# Patient Record
Sex: Female | Born: 1980 | Hispanic: No | Marital: Married | Smoking: Never smoker
Health system: Southern US, Community
[De-identification: ages and names within clinical notes are randomized; demographics above are authoritative.]

## PROBLEM LIST (undated history)

## (undated) DIAGNOSIS — IMO0002 Reserved for concepts with insufficient information to code with codable children: Secondary | ICD-10-CM

## (undated) DIAGNOSIS — F32A Depression, unspecified: Secondary | ICD-10-CM

## (undated) DIAGNOSIS — R87619 Unspecified abnormal cytological findings in specimens from cervix uteri: Secondary | ICD-10-CM

## (undated) DIAGNOSIS — E669 Obesity, unspecified: Secondary | ICD-10-CM

## (undated) DIAGNOSIS — F329 Major depressive disorder, single episode, unspecified: Secondary | ICD-10-CM

## (undated) DIAGNOSIS — O24419 Gestational diabetes mellitus in pregnancy, unspecified control: Secondary | ICD-10-CM

## (undated) HISTORY — DX: Reserved for concepts with insufficient information to code with codable children: IMO0002

## (undated) HISTORY — DX: Unspecified abnormal cytological findings in specimens from cervix uteri: R87.619

---
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
## 2002-02-07 ENCOUNTER — Other Ambulatory Visit: Admission: RE | Admit: 2002-02-07 | Discharge: 2002-02-07 | Payer: Self-pay | Admitting: Obstetrics and Gynecology

## 2002-08-05 ENCOUNTER — Other Ambulatory Visit: Admission: RE | Admit: 2002-08-05 | Discharge: 2002-08-05 | Payer: Self-pay | Admitting: Obstetrics and Gynecology

## 2003-03-10 ENCOUNTER — Other Ambulatory Visit: Admission: RE | Admit: 2003-03-10 | Discharge: 2003-03-10 | Payer: Self-pay | Admitting: Obstetrics and Gynecology

## 2008-09-19 HISTORY — PX: WISDOM TOOTH EXTRACTION: SHX21

## 2008-11-11 ENCOUNTER — Encounter: Admission: RE | Admit: 2008-11-11 | Discharge: 2008-11-12 | Payer: Self-pay | Admitting: Obstetrics and Gynecology

## 2008-12-16 ENCOUNTER — Other Ambulatory Visit: Payer: Self-pay | Admitting: Obstetrics & Gynecology

## 2008-12-17 ENCOUNTER — Inpatient Hospital Stay (HOSPITAL_COMMUNITY): Admission: AD | Admit: 2008-12-17 | Discharge: 2008-12-21 | Payer: Self-pay | Admitting: Obstetrics and Gynecology

## 2008-12-22 ENCOUNTER — Encounter: Admission: RE | Admit: 2008-12-22 | Discharge: 2009-01-20 | Payer: Self-pay | Admitting: Obstetrics & Gynecology

## 2009-01-21 ENCOUNTER — Encounter: Admission: RE | Admit: 2009-01-21 | Discharge: 2009-02-20 | Payer: Self-pay | Admitting: Obstetrics and Gynecology

## 2009-02-21 ENCOUNTER — Encounter: Admission: RE | Admit: 2009-02-21 | Discharge: 2009-03-22 | Payer: Self-pay | Admitting: Obstetrics and Gynecology

## 2009-03-23 ENCOUNTER — Encounter: Admission: RE | Admit: 2009-03-23 | Discharge: 2009-04-22 | Payer: Self-pay | Admitting: Obstetrics and Gynecology

## 2009-04-23 ENCOUNTER — Encounter: Admission: RE | Admit: 2009-04-23 | Discharge: 2009-05-23 | Payer: Self-pay | Admitting: Obstetrics and Gynecology

## 2009-05-24 ENCOUNTER — Encounter: Admission: RE | Admit: 2009-05-24 | Discharge: 2009-06-22 | Payer: Self-pay | Admitting: Obstetrics and Gynecology

## 2009-06-23 ENCOUNTER — Encounter: Admission: RE | Admit: 2009-06-23 | Discharge: 2009-07-23 | Payer: Self-pay | Admitting: Obstetrics and Gynecology

## 2009-07-24 ENCOUNTER — Encounter: Admission: RE | Admit: 2009-07-24 | Discharge: 2009-08-22 | Payer: Self-pay | Admitting: Obstetrics and Gynecology

## 2009-08-23 ENCOUNTER — Encounter: Admission: RE | Admit: 2009-08-23 | Discharge: 2009-09-17 | Payer: Self-pay | Admitting: Obstetrics and Gynecology

## 2009-09-23 ENCOUNTER — Encounter: Admission: RE | Admit: 2009-09-23 | Discharge: 2009-10-12 | Payer: Self-pay | Admitting: Obstetrics and Gynecology

## 2010-12-29 LAB — CBC
HCT: 30.9 % — ABNORMAL LOW (ref 36.0–46.0)
Hemoglobin: 10.3 g/dL — ABNORMAL LOW (ref 12.0–15.0)
MCHC: 33.2 g/dL (ref 30.0–36.0)
RBC: 3.78 MIL/uL — ABNORMAL LOW (ref 3.87–5.11)
RDW: 15.8 % — ABNORMAL HIGH (ref 11.5–15.5)

## 2010-12-29 LAB — GLUCOSE, CAPILLARY: Glucose-Capillary: 118 mg/dL — ABNORMAL HIGH (ref 70–99)

## 2010-12-30 LAB — CBC
HCT: 35.4 % — ABNORMAL LOW (ref 36.0–46.0)
Hemoglobin: 11.7 g/dL — ABNORMAL LOW (ref 12.0–15.0)
MCV: 81.3 fL (ref 78.0–100.0)
Platelets: 235 10*3/uL (ref 150–400)
WBC: 12.3 10*3/uL — ABNORMAL HIGH (ref 4.0–10.5)

## 2010-12-30 LAB — BASIC METABOLIC PANEL
BUN: 11 mg/dL (ref 6–23)
Chloride: 108 mEq/L (ref 96–112)
GFR calc non Af Amer: >60 mL/min (ref >60)
Glucose, Bld: 110 mg/dL — ABNORMAL HIGH (ref 70–99)
Potassium: 3.9 mEq/L (ref 3.5–5.1)
Sodium: 136 mEq/L (ref 135–145)

## 2010-12-30 LAB — GLUCOSE, CAPILLARY: Glucose-Capillary: 96 mg/dL (ref 70–99)

## 2011-02-01 NOTE — Op Note (Signed)
NAME:  [name], SECKEL NO.:  [id number]   MEDICAL RECORD NO.:  [id number]          PATIENT TYPE:  INP   LOCATION:  9318                          FACILITY:  [facility]   PHYSICIAN:  Freddy Finner, M.D.   DATE OF BIRTH:  1981/06/24   DATE OF PROCEDURE:  12/17/2008  DATE OF DISCHARGE:                               OPERATIVE REPORT   PREOPERATIVE DIAGNOSES:  1. Gestational diabetes.  2. Intrauterine pregnancy at 37-3/[redacted] weeks gestation.  3. Pregnancy-induced hypertension.  4. Complete breech presentation.  5. Amniocentesis with mature lecithin/sphingomyelin ratio and positive      phosphatidyl-glycerol on the day prior to surgery.   Details of the present illness recorded in the admission note.  The  patient was admitted on the morning of surgery.  She had amniocentesis  in the office on the March 30 which showed an LS ratio of 5:1, positive  PG.  Because of her pregnancy complications and the breech presentation,  she was admitted for cesarean delivery.   She was admitted on the morning of surgery.  She was actually found to  have ruptured membranes approximately 7:30 a.m. and was started Ceftin  by Dr. Marton Redwood pending her scheduled cesarean time of 1:00 p.m.  At that  hour, she was brought to the operating room and placed under adequate  spinal anesthesia, placed in dorsal recumbent position with elevation of  the right hip by 15 degrees.  The abdomen was prepped in the usual  fashion.  Foley catheter was placed using surgical sterile technique.  Sterile drapes were applied.  Lower abdominal transverse incision was  made and carried sharply down to fascia.  Fascia was entered sharply,  extended to the skin incision.  Rectus sheath was divided in the  midline.  Peritoneum was elevated, entered sharply and extended bluntly  to the external skin incision.  A transverse incision was made of the  vesical peritoneum overlying the lower uterine segment and the bladder  bluntly  dissected off the lower segment.  Transverse incision was made  in the lower uterine segment and extended bluntly.  The infant was  basically in a complex breech presentation, one leg was down below the  buttocks, one was above the buttocks.  The infant's left leg was easily  delivered and then with abdominal pressure, the leg and buttocks were  delivered allowing extension of the leg.  The complete extraction was  completed without difficulty or significant trauma.  Cord blood was  obtained for arterial blood gases and blood gas was 7.26.  The placental  cord blood was donated.  After delivery of the placenta, the uterus was  delivered onto the anterior abdominal wall.  The uterine cavity was  carefully evacuated and confirmed complete by manual exploration.  Uterine incision was closed in a double layer running locking, 0  Monocryl was used for the first layer and an imbricating suture of 0  Monocryl for the second.  Irrigation was carried out.  Hemostasis was  complete.  Uterus was delivered back into the abdominal cavity.  Again  irrigation  was carried out and hemostasis was confirmed.  All pack,  needle and instrument counts were correct.  Abdominal incision was then  closed in layers.  Running 0 Monocryl was used to close peritoneum and  reapproximate the rectus muscles.  Fascia was closed with a looped PDS  running from angle to angle on either side.  Subcutaneous tissue was approximated with running 2-0 plain.  Skin was  closed with skin staples and quarter-inch Steri-Strips.  The patient  tolerated the operative procedure well and was taken to the recovery  room in good condition.      Freddy Finner, M.D.  Electronically Signed     WRN/MEDQ  D:  12/17/2008  T:  12/17/2008  Job:  [id number]

## 2011-02-01 NOTE — H&P (Signed)
[name], [name] NO.:  [id number]   MEDICAL RECORD NO.:  [id number]          PATIENT TYPE:  INP   LOCATION:  9318                          [facility]ACILITY:  [facility]   PHYSICIAN:  Freddy Finner, M.D.   DATE OF BIRTH:  1981-05-08   DATE OF ADMISSION:  12/17/2008  DATE OF DISCHARGE:                              HISTORY & PHYSICAL   ADMISSION DIAGNOSES:  1. Intrauterine pregnancy, 37-2/[redacted] weeks gestation, breech      presentation.  2. Gestational diabetes.  3. Late onset pregnancy-induced hypertension.   Patient is a 30 year old white married female, primigravida, who has  been followed to 37-2/[redacted] weeks gestation with progressively increasing  blood pressure and edema.  She is a gestational diabetic with expected  large for gestational size with a breech presenting fetus.  She had  amniocentesis in the office on the prior to procedure and had a mature  L/S and a positive PG.  She is admitted now for delivery.  Her current  review of systems is negative even though her pressure is elevated, as  she has proteinuria.  She does not have any CNS or GI symptoms.   ABDOMEN:  Gravid.  Estimated fetal size of greater than 8 pounds.  Fetal  heart tone is heard in the right upper, outer quadrant.  HEART:  Normal sinus rhythm.  No murmurs, rubs, or gallops.  LUNGS:  Clear to auscultation.  PELVIC:  Deferred.  EXTREMITIES:  Edema +3.  Deep tendon reflexes +2.   ASSESSMENT:  1. Intrauterine pregnancy, 37-2/[redacted] weeks gestation.  2. Gestational diabetes.  3. Pregnancy-induced hypertension.  4. Mature lecithin/sphingomyelin and phosphatidylglycerol on amniotic.   PLAN:  Cesarean delivery.      Freddy Finner, M.D.  Electronically Signed     WRN/MEDQ  D:  12/17/2008  T:  12/17/2008  Job:  [id number]

## 2011-02-01 NOTE — Discharge Summary (Signed)
NAMECRISTELLA, STIVER [name].:  [id number]   MEDICAL RECORD [name].:  [id number]          PATIENT TYPE:  INP   LOCATION:  9318                          [facility]ACILITY:  [facility]   PHYSICIAN:  Zelphia Cairo, MD    DATE OF BIRTH:  [birth year]   DATE OF ADMISSION:  12/17/2008  DATE OF DISCHARGE:  12/21/2008                               DISCHARGE SUMMARY   ADMITTING DIAGNOSES:  1. Intrauterine pregnancy at 37-3/7 weeks' estimated gestational age.  2. Gestational diabetes.  3. Complete breech presentation.  4. Pregnancy-induced hypertension.   DISCHARGE DIAGNOSES:  1. Status post low transverse cesarean section.  2. Viable female infant.   PROCEDURE:  Primary low transverse cesarean section.   REASON FOR ADMISSION:  Please see dictated H and P.   HOSPITAL COURSE:  The patient is a 30 year old primigravida, was  admitted to [facility] with spontaneous rupture of  membranes.  The patient had been scheduled for cesarean section later  that same morning.  The patient did have gestational diabetes and had  undergone an amniocentesis, which had revealed a mature LS ratio on the  day prior to admission.  The patient also had known complete breech  presentation.  On admission, vital signs were stable.  The patient was  started on IV antibiotics due to spontaneous rupture of membranes and  deflates cesarean delivery.  At this scheduled time, the patient was  then transferred to the operating room where spinal anesthesia was  administered without difficulty.  A low transverse incision was made  with the delivery of a viable female infant with Apgars of 9 at one and 9  at five minutes.  Arterial cord pH was 7.26.  The patient tolerated  procedure well and taken to the recovery room in stable condition.  On  postoperative day #1, the patient was without complaint.  Baby was in  the NICU for sugar control.  Vital signs were stable.  Blood pressure  140/82 to 126/78.  Abdomen  soft.  Fundus firm and nontender.  Incision  was clean, dry, and intact.  Foley had been discontinued and the patient  was voiding well.  Laboratory findings revealed hemoglobin of 10.3,  platelet count 182,000, and WBC count of 11.3.  On postoperative day #2,  the patient was without complaint.  Vital signs were stable.  She was  afebrile.  Fundus firm and nontender.  Incision was clean, dry, and  intact.  Fasting blood sugar was 102, postprandial was 112.  On  postoperative day #3, the patient complained of some anxiety.  Pain had  not been well controlled throughout the night.  Vital signs were stable.  She was afebrile.  Fundus was firm and nontender.  Incision was clean,  dry, and intact.  The patient was started on some Zoloft.  On  postoperative day #4, the patient was feeling better.  Vital signs were  stable.  She was afebrile.  Fundus firm and nontender.  Incision was  clean, dry, and intact.  Staples removed and the patient was later  discharged home.  CONDITION ON DISCHARGE:  Stable.   DIET:  Regular as tolerated.   ACTIVITY:  [name] heavy lifting, [name] driving x2 weeks, [name] vaginal entry.   FOLLOWUP:  The patient is to follow up in the office in 1-2 weeks for  incision check.  She is to call for temperature greater than 100  degrees, persistent nausea, vomiting, heavy vaginal bleeding, and/or  redness or drainage from incisional site.   DISCHARGE MEDICATIONS:  1. Tylox #30 one p.o. 4-6 hours p.r.n.  2. Motrin 600 mg every 6 hours.  3. Zoloft 50 mg 1 p.o. daily.  4. Prenatal vitamins 1 p.o. daily.      Julio Sicks, N.P.      Zelphia Cairo, MD  Electronically Signed    CC/MEDQ  D:  01/05/2009  T:  01/06/2009  Job:  [id number]

## 2011-11-17 ENCOUNTER — Ambulatory Visit (INDEPENDENT_AMBULATORY_CARE_PROVIDER_SITE_OTHER): Payer: BC Managed Care – PPO | Admitting: Internal Medicine

## 2011-11-17 ENCOUNTER — Encounter: Payer: Self-pay | Admitting: Internal Medicine

## 2011-11-17 VITALS — BP 120/84 | HR 89 | Temp 98.3°F | Ht 63.75 in | Wt 244.1 lb

## 2011-11-17 DIAGNOSIS — R87619 Unspecified abnormal cytological findings in specimens from cervix uteri: Secondary | ICD-10-CM | POA: Insufficient documentation

## 2011-11-17 DIAGNOSIS — E669 Obesity, unspecified: Secondary | ICD-10-CM

## 2011-11-17 DIAGNOSIS — K625 Hemorrhage of anus and rectum: Secondary | ICD-10-CM

## 2011-11-17 DIAGNOSIS — R6889 Other general symptoms and signs: Secondary | ICD-10-CM

## 2011-11-17 DIAGNOSIS — IMO0002 Reserved for concepts with insufficient information to code with codable children: Secondary | ICD-10-CM

## 2011-11-17 LAB — CBC WITH DIFFERENTIAL/PLATELET
HCT: 40 % (ref 36.0–46.0)
Hemoglobin: 13.2 g/dL (ref 12.0–15.0)
Lymphocytes Relative: 26 % (ref 12–46)
Lymphs Abs: 2.5 10*3/uL (ref 0.7–4.0)
MCHC: 33 g/dL (ref 30.0–36.0)
Monocytes Absolute: 0.5 10*3/uL (ref 0.1–1.0)
Monocytes Relative: 5 % (ref 3–12)
Neutro Abs: 6.5 10*3/uL (ref 1.7–7.7)
Neutrophils Relative %: 68 % (ref 43–77)
RBC: 4.62 MIL/uL (ref 3.87–5.11)

## 2011-11-17 LAB — COMPREHENSIVE METABOLIC PANEL
Albumin: 4.2 g/dL (ref 3.5–5.2)
Alkaline Phosphatase: 66 U/L (ref 39–117)
BUN: 14 mg/dL (ref 6–23)
CO2: 26 mEq/L (ref 19–32)
Calcium: 9.5 mg/dL (ref 8.4–10.5)
Chloride: 105 mEq/L (ref 96–112)
Glucose, Bld: 81 mg/dL (ref 70–99)
Potassium: 4.2 mEq/L (ref 3.5–5.3)
Sodium: 141 mEq/L (ref 135–145)
Total Protein: 7.1 g/dL (ref 6.0–8.3)

## 2011-11-17 LAB — LIPID PANEL
Cholesterol: 165 mg/dL (ref 0–200)
HDL: 57 mg/dL (ref >39)
LDL Cholesterol: 70 mg/dL (ref 0–99)
Triglycerides: 189 mg/dL — ABNORMAL HIGH (ref <150)

## 2011-11-17 MED ORDER — HYDROCORTISONE ACETATE 30 MG RE SUPP: RECTAL | Status: DC

## 2011-11-17 NOTE — Progress Notes (Signed)
  Subjective:    Patient ID: [name], female    DOB: 22-Nov-1980, 31 y.o.   MRN: [id number]  HPI  New pt here for first visit.  No primary care.  GYN Dr. Perlie Gold.   Pt is concerned over acute issue of bloody stools.  She noticed yesterday am and this am blood after BM in toilet and on tissue.  No abd pain no cramping, no diarrhea.  Normal pattern once in am.  She has constipation occasionally not on a regular basis.  No fever no appetite change.  Mother recently diagnosed with Crohns.    Allergies  Allergen Reactions  . Tamiflu (Oseltamivir Phosphate)     White tongue, cotton mouth   Past Medical History  Diagnosis Date  . Abnormal Pap smear    Past Surgical History  Procedure Date  . Cesarean section 12/17/08  . Wisdom tooth extraction 2010   History   Social History  . Marital Status: Married    Spouse Name: N/A    Number of Children: N/A  . Years of Education: N/A   Occupational History  . Not on file.   Social History Main Topics  . Smoking status: Never Smoker   . Smokeless tobacco: Never Used  . Alcohol Use: No  . Drug Use: No  . Sexually Active: Yes    Birth Control/ Protection: None   Other Topics Concern  . Not on file   Social History Narrative  . No narrative on file   Family History  Problem Relation Age of Onset  . Crohn's disease Mother   . Heart disease Father   . Heart disease Maternal Grandfather    Patient Active Problem List  Diagnoses  . Abnormal Pap smear  . Obesity   No current outpatient prescriptions on file prior to visit.        Review of Systems See HPI    Objective:   Physical Exam Physical Exam  Nursing note and vitals reviewed.  Constitutional: She is oriented to person, place, and time. She appears well-developed and well-nourished.  HENT:  Head: Normocephalic and atraumatic.  Cardiovascular: Normal rate and regular rhythm. Exam reveals no gallop and no friction rub.  No murmur heard.  Pulmonary/Chest:  Breath sounds normal. She has no wheezes. She has no rales.  Abd:  Soft nondistended non tender.  BS pos.  NO HSM.  REctal   Brown stook guaiac pos Neurological: She is alert and oriented to person, place, and time.  Skin: Skin is warm and dry.  Psychiatric: She has a normal mood and affect. Her behavior is normal.         Assessment & Plan:  1)  REcrtal  Bleed:  Reviewed broad differential with pt including inflammatory bowel disease.  Will check labs, CBC  Empirically try Proctocort supp. For 5 days only.  Rechedk 1 week.  If continued bleeding will need colonoscopy.   Pt ot call MOnday if bleeding worsens.  She voices understanding 2) Obesity 3)  Remote abnormal pap

## 2011-11-17 NOTE — Progress Notes (Signed)
Addended by: Chip Boer on: 11/17/2011 02:39 PM   Modules accepted: Orders

## 2011-11-17 NOTE — Patient Instructions (Signed)
See Me next week.   If worsening bleeding call office on MOnday  Labs today

## 2011-11-21 ENCOUNTER — Encounter: Payer: Self-pay | Admitting: Emergency Medicine

## 2011-11-23 ENCOUNTER — Telehealth: Payer: Self-pay | Admitting: Internal Medicine

## 2011-11-23 ENCOUNTER — Ambulatory Visit: Payer: BC Managed Care – PPO | Admitting: Internal Medicine

## 2011-11-23 NOTE — Telephone Encounter (Signed)
Call pt on Thursday and check on how she is doing.  Give her a new appt. But not on Tuesday.  Message back with appt time

## 2011-11-24 NOTE — Telephone Encounter (Signed)
Left message on voicemail for Chidinma to return call to the office

## 2011-12-01 NOTE — Telephone Encounter (Signed)
LMOVM for her to return call to the office, schedule follow up visit with DDS

## 2011-12-01 NOTE — Telephone Encounter (Signed)
[name] called back, states she has been doing well, got caught up with work and forgot to return the last message.  Scheduled follow up for Wed 3/20 @ 4pm

## 2011-12-07 ENCOUNTER — Ambulatory Visit (INDEPENDENT_AMBULATORY_CARE_PROVIDER_SITE_OTHER): Payer: BC Managed Care – PPO | Admitting: Internal Medicine

## 2011-12-07 ENCOUNTER — Encounter: Payer: Self-pay | Admitting: Internal Medicine

## 2011-12-07 VITALS — BP 114/75 | HR 83 | Temp 98.6°F | Ht 63.75 in | Wt 245.0 lb

## 2011-12-07 DIAGNOSIS — R7989 Other specified abnormal findings of blood chemistry: Secondary | ICD-10-CM

## 2011-12-07 DIAGNOSIS — K625 Hemorrhage of anus and rectum: Secondary | ICD-10-CM

## 2011-12-07 NOTE — Patient Instructions (Signed)
To have labs today  Return if any further problems with bleeding

## 2011-12-07 NOTE — Progress Notes (Signed)
  Subjective:    Patient ID: [name], female    DOB: Jun 11, 1981, 31 y.o.   MRN: [id number]  HPI Amila is here for return of rectal bleeding.  She reports bleeding resolved after 24-48 hours and has not returned.  No abd pain or cramping.  Appetitie good  See labs.  Isolated elevation of one transaminase/  No report of hepatitis is past per pt   Allergies  Allergen Reactions  . Tamiflu (Oseltamivir Phosphate)     White tongue, cotton mouth   Past Medical History  Diagnosis Date  . Abnormal Pap smear    Past Surgical History  Procedure Date  . Cesarean section 12/17/08  . Wisdom tooth extraction 2010   History   Social History  . Marital Status: Married    Spouse Name: N/A    Number of Children: N/A  . Years of Education: N/A   Occupational History  . Not on file.   Social History Main Topics  . Smoking status: Never Smoker   . Smokeless tobacco: Never Used  . Alcohol Use: No  . Drug Use: No  . Sexually Active: Yes    Birth Control/ Protection: None   Other Topics Concern  . Not on file   Social History Narrative  . No narrative on file   Family History  Problem Relation Age of Onset  . Crohn's disease Mother   . Heart disease Father   . Heart disease Maternal Grandfather    Patient Active Problem List  Diagnoses  . Abnormal Pap smear  . Obesity  . Elevated transaminase measurement   Current Outpatient Prescriptions on File Prior to Visit  Medication Sig Dispense Refill  . Multiple Vitamin (MULTIVITAMIN) tablet Take 1 tablet by mouth daily.      Marland Kitchen HYDROCORTISONE ACE, RECTAL, 30 MG SUPP Insert one each day after bowel movement  5 each  0       Review of Systems See HPI    Objective:   Physical Exam Physical Exam  Nursing note and vitals reviewed.  Constitutional: She is oriented to person, place, and time. She appears well-developed and well-nourished.  HENT:  Head: Normocephalic and atraumatic.  Cardiovascular: Normal rate and regular  rhythm. Exam reveals no gallop and no friction rub.  No murmur heard.  Pulmonary/Chest: Breath sounds normal. She has no wheezes. She has no rales.  Abd: Soft nontender nondistended  BS Pos Neurological: She is alert and oriented to person, place, and time.  Skin: Skin is warm and dry.  Psychiatric: She has a normal mood and affect. Her behavior is normal.              Assessment & Plan:  1)  REctal bleeding resolved.  I counseled pt that even though resolved cannot say cause of bleeding exactly.  We discussed endoscopic eval but she declines at this time.  If bleeding recurs.  Will refer 2)  Isolated transaminase elevation  Will recehck today

## 2011-12-08 LAB — HEPATIC FUNCTION PANEL
ALT: 51 U/L — ABNORMAL HIGH (ref 0–35)
Alkaline Phosphatase: 73 U/L (ref 39–117)
Bilirubin, Direct: 0.1 mg/dL (ref 0.0–0.3)
Indirect Bilirubin: 0.2 mg/dL (ref 0.0–0.9)
Total Protein: 7.1 g/dL (ref 6.0–8.3)

## 2011-12-08 NOTE — Progress Notes (Signed)
Addended by: Chip Boer on: 12/08/2011 04:07 PM   Modules accepted: Orders

## 2011-12-09 LAB — HEPATITIS PANEL, ACUTE
Hep A IgM: NEGATIVE
Hep B C IgM: NEGATIVE

## 2011-12-20 ENCOUNTER — Telehealth: Payer: Self-pay | Admitting: Internal Medicine

## 2011-12-20 NOTE — Telephone Encounter (Signed)
Spoke with pt.  And informed of liver blood work and negative hepatitisscreen will get liver ultrasound this week. She voices understanding

## 2011-12-22 ENCOUNTER — Ambulatory Visit (HOSPITAL_BASED_OUTPATIENT_CLINIC_OR_DEPARTMENT_OTHER)
Admission: RE | Admit: 2011-12-22 | Discharge: 2011-12-22 | Disposition: A | Discharge status: Home / Self Care | Payer: BC Managed Care – PPO | Source: Ambulatory Visit | Attending: Internal Medicine | Admitting: Internal Medicine

## 2011-12-22 DIAGNOSIS — R7989 Other specified abnormal findings of blood chemistry: Secondary | ICD-10-CM

## 2011-12-22 DIAGNOSIS — R945 Abnormal results of liver function studies: Secondary | ICD-10-CM | POA: Insufficient documentation

## 2011-12-28 ENCOUNTER — Telehealth: Payer: Self-pay | Admitting: Internal Medicine

## 2011-12-28 ENCOUNTER — Encounter: Payer: Self-pay | Admitting: Internal Medicine

## 2011-12-28 DIAGNOSIS — K760 Fatty (change of) liver, not elsewhere classified: Secondary | ICD-10-CM | POA: Insufficient documentation

## 2011-12-28 NOTE — Telephone Encounter (Signed)
Spoke with pt and informed of ultrasound that shows fatty infiltration of liver no mass

## 2012-11-25 IMAGING — US US ABDOMEN COMPLETE
1 series · 14 of 25 positions shown · non-contrast
Comparison: None.

CLINICAL DATA: Elevated LFTs

COMPLETE ABDOMINAL ULTRASOUND

[Series 1: us abdomen complete · 0.35mm/px · 14 of 68 slices shown]
[im 1/68]
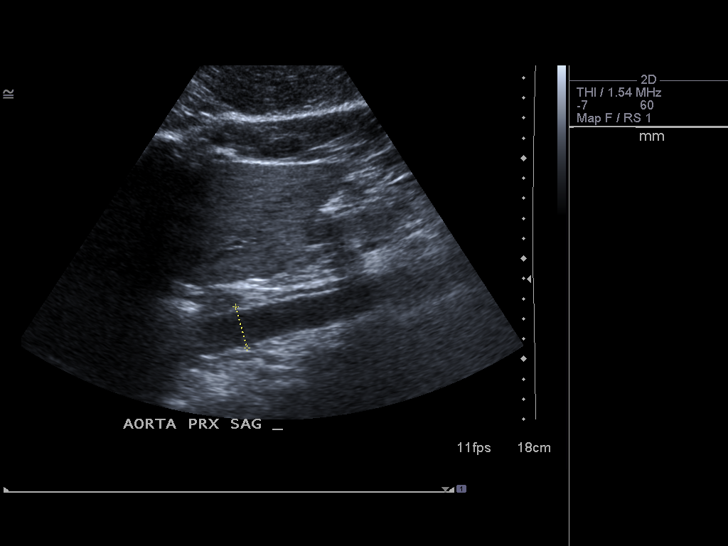
[im 6/68]
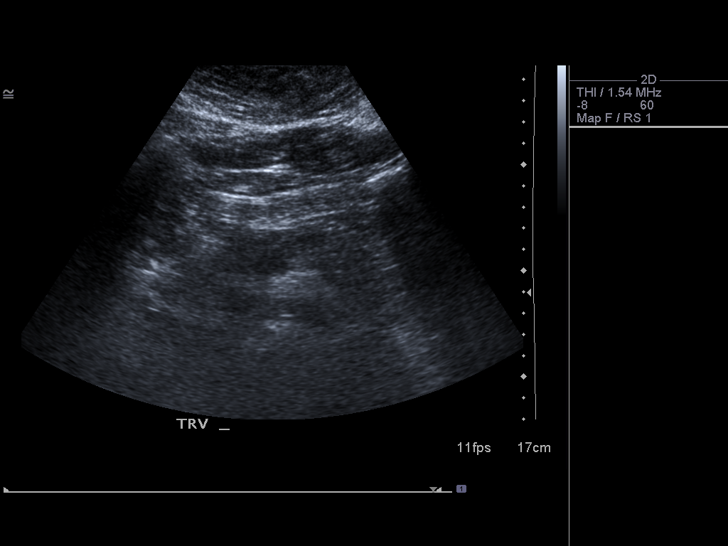
[im 12/68]
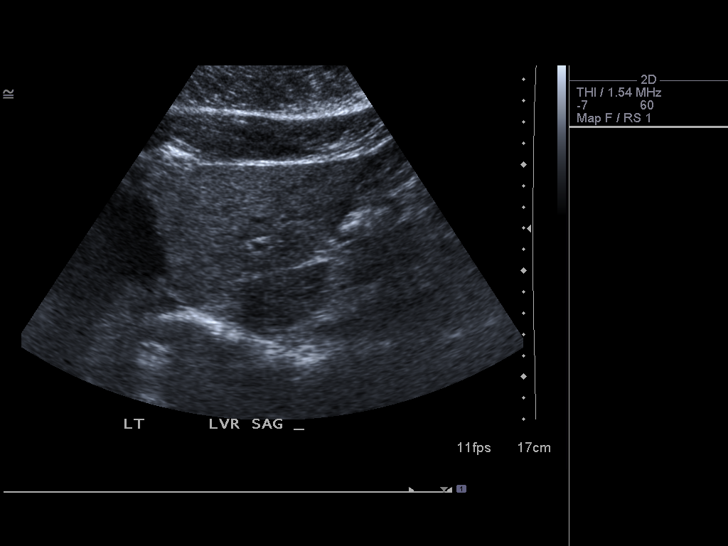
[im 17/68]
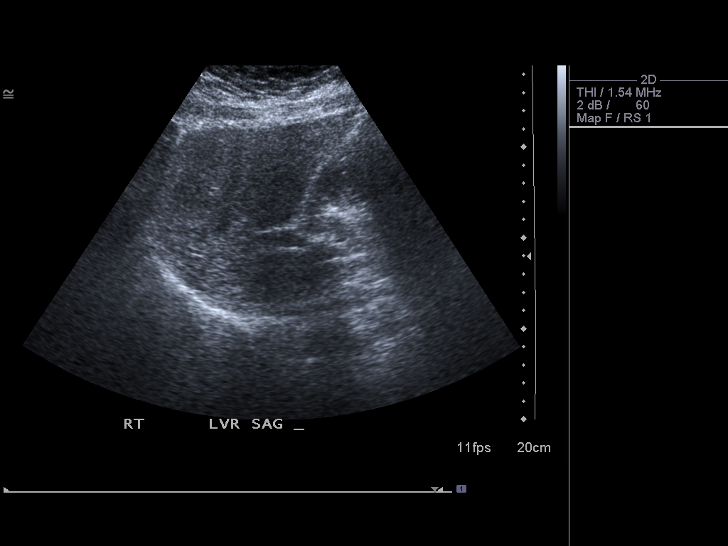
[im 23/68]
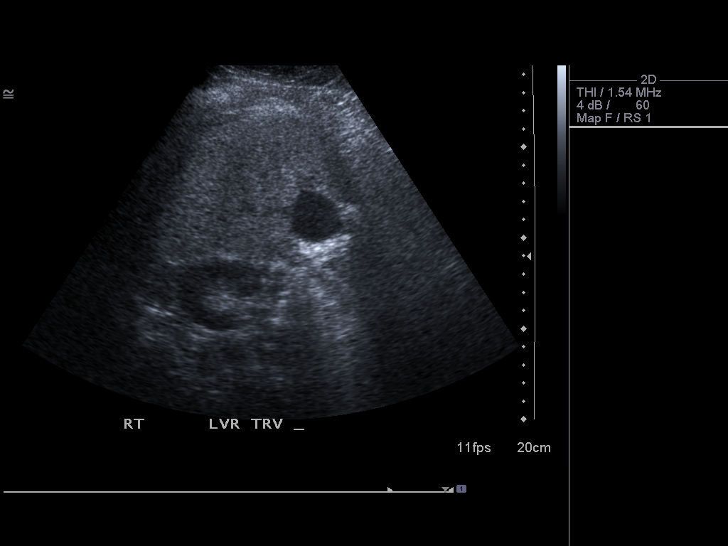
[im 26/68]
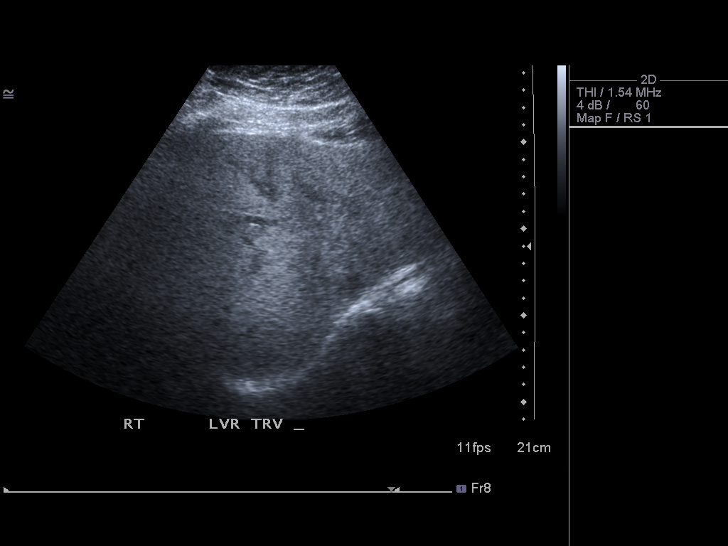
[im 31/68]
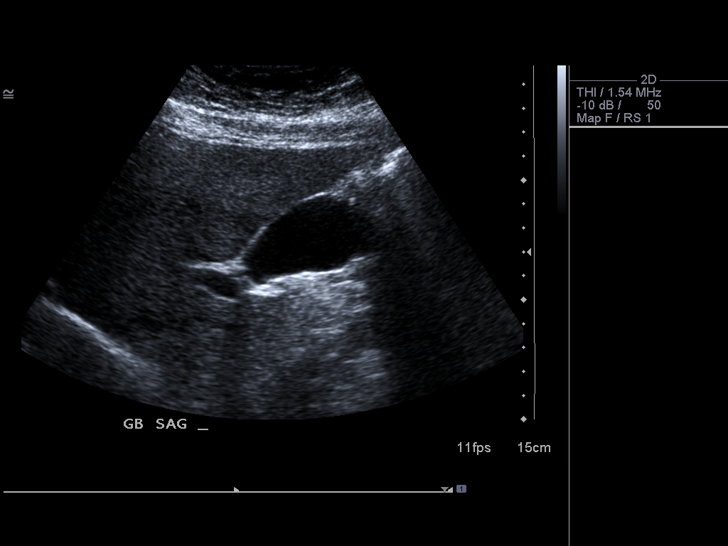
[im 37/68]
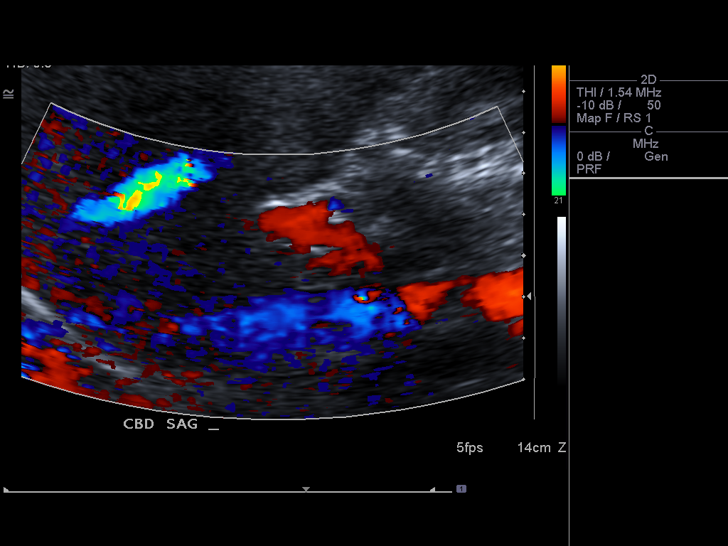
[im 42/68]
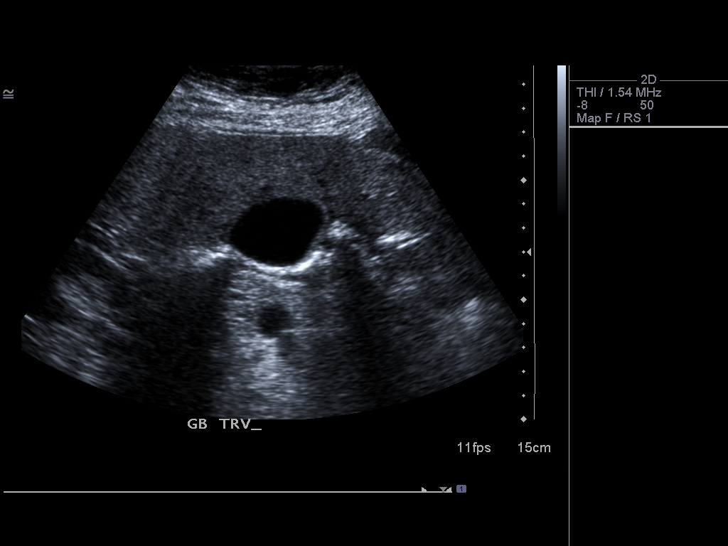
[im 45/68]
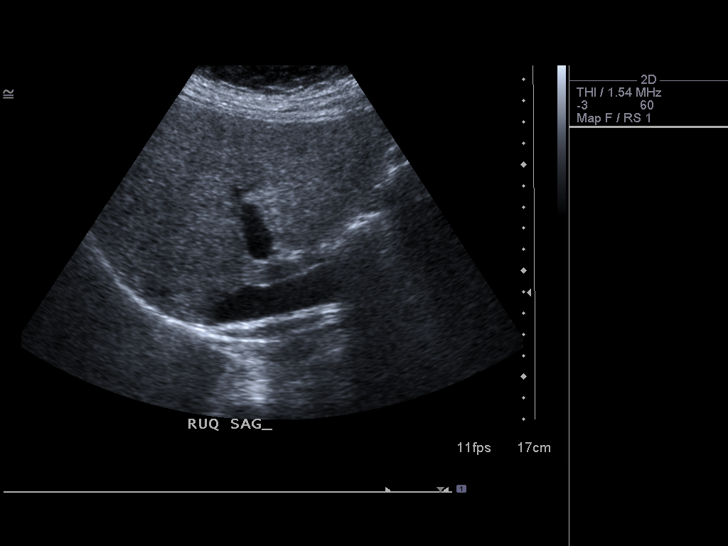
[im 51/68]
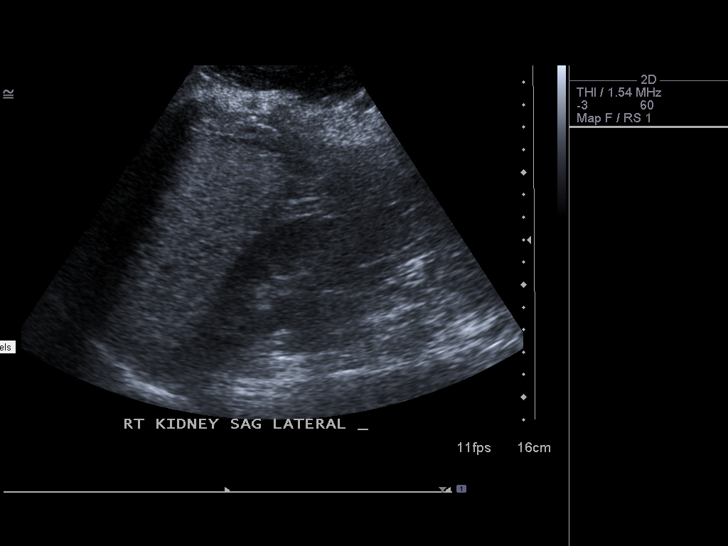
[im 56/68]
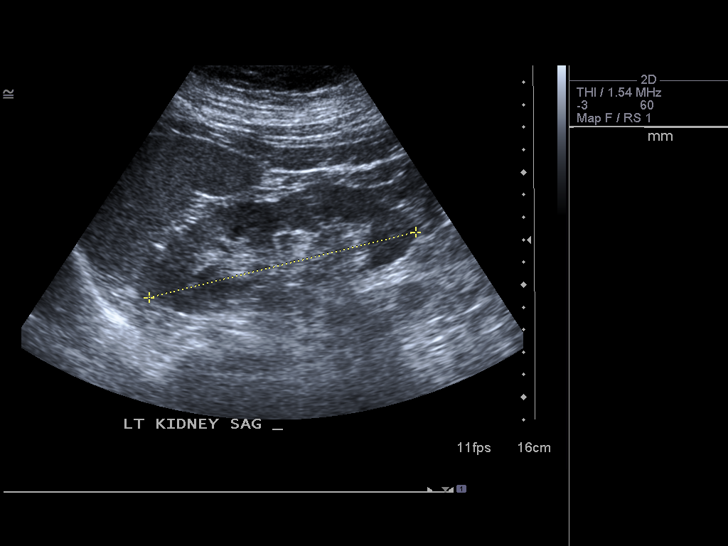
[im 62/68]
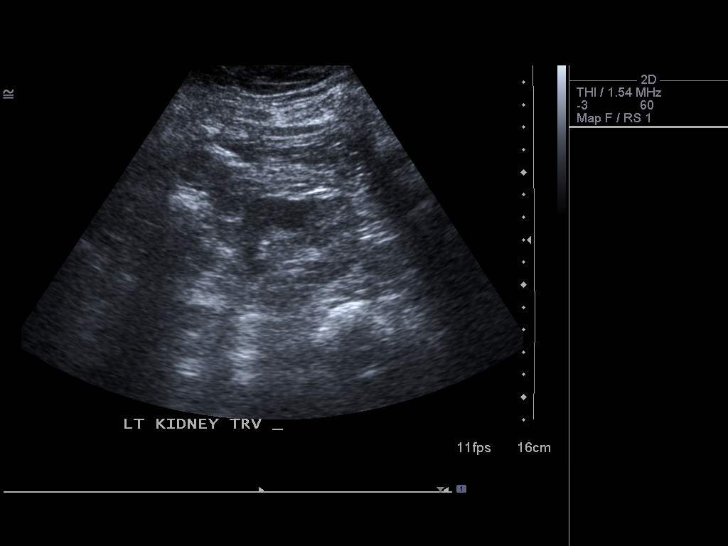
[im 68/68]
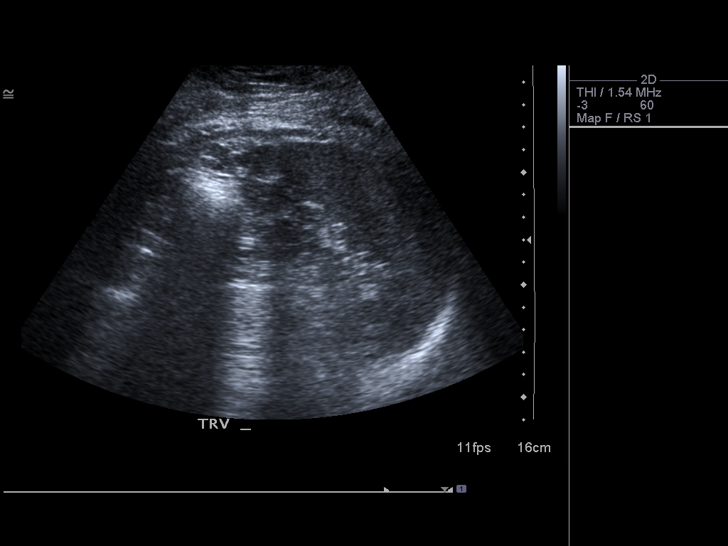

[14 of 25 positions shown; findings below may reference images not displayed]

FINDINGS: Gallbladder:  No gallstones, gallbladder wall thickening, or
pericholecystic fluid. No sonographic Murphy's sign

Common bile duct:  Measures 2 mm in diameter within normal limits.

Liver:  No focal lesion identified. Liver shows diffuse increased
echogenicity suspicious for fatty infiltration.

IVC:  Limited visualization due to abundant bowel gas.

Pancreas:  Limited visualization due to bowel gas.

Spleen:  Measures 7.7 cm in length.  Normal echogenicity.

Right Kidney:  Measures 11.8 cm in length.  No mass, hydronephrosis
or diagnostic renal calculus

Left Kidney:  Measures 12.2 cm in length.  No mass, hydronephrosis
or diagnostic renal calculus

Abdominal aorta:  No aneurysm identified. Measures up to 2.1 cm in
diameter.  Limited visualization due to abundant bowel gas.
IMPRESSION: 1.  No gallstones are noted within gallbladder.  Normal CBD.
2.  No hydronephrosis or diagnostic renal calculus.
3.  Probable fatty infiltration of the liver.

## 2013-06-24 LAB — OB RESULTS CONSOLE GBS: GBS: POSITIVE

## 2013-06-28 ENCOUNTER — Inpatient Hospital Stay (HOSPITAL_COMMUNITY)
Admission: AD | Admit: 2013-06-28 | Discharge: 2013-06-28 | Disposition: A | Discharge status: Home / Self Care | Payer: BC Managed Care – PPO | Source: Ambulatory Visit | Attending: Obstetrics and Gynecology | Admitting: Obstetrics and Gynecology

## 2013-06-28 ENCOUNTER — Encounter (HOSPITAL_COMMUNITY): Payer: Self-pay

## 2013-06-28 ENCOUNTER — Inpatient Hospital Stay (HOSPITAL_COMMUNITY): Payer: BC Managed Care – PPO

## 2013-06-28 DIAGNOSIS — O289 Unspecified abnormal findings on antenatal screening of mother: Secondary | ICD-10-CM

## 2013-06-28 DIAGNOSIS — O288 Other abnormal findings on antenatal screening of mother: Secondary | ICD-10-CM

## 2013-06-28 DIAGNOSIS — IMO0002 Reserved for concepts with insufficient information to code with codable children: Secondary | ICD-10-CM | POA: Insufficient documentation

## 2013-06-28 DIAGNOSIS — R609 Edema, unspecified: Secondary | ICD-10-CM

## 2013-06-28 LAB — URINALYSIS, ROUTINE W REFLEX MICROSCOPIC
Glucose, UA: NEGATIVE mg/dL
Leukocytes, UA: NEGATIVE
Protein, ur: NEGATIVE mg/dL
Specific Gravity, Urine: >1.03 — ABNORMAL HIGH (ref 1.005–1.030)
Urobilinogen, UA: 0.2 mg/dL (ref 0.0–1.0)

## 2013-06-28 NOTE — MAU Provider Note (Signed)
History     CSN: [id number]  Arrival date and time: 06/28/13 1636   First Provider Initiated Contact with Patient 06/28/13 1725      Chief Complaint  Patient presents with  . Hypertension  . Leg Swelling   HPI [name] is 32 y.o. G2P1 [redacted]w[redacted]d weeks presenting after checking her blood pressure at [employer]--BP 158/96.  She is a patient of Dr. Lynnell Dike.  She has history of gestational hypertension and gestational diabetes with previous pregnancy.  Borderline glucose with this pregnancy. Has not had elevated blood pressures in the office with this pregnancy.  Denies vaginal bleeding, leaking of fluid, decreased fetal movement and uterine contractions.   She does have pedal edema that began 2 days ago--has had it with this pregnancy but usually goes always.  The swelling has been constant.  Denies visual changes, headache, or chest pain.     Past Medical History  Diagnosis Date  . Abnormal Pap smear     Past Surgical History  Procedure Laterality Date  . Cesarean section  12/17/08  . Wisdom tooth extraction  2010    Family History  Problem Relation Age of Onset  . Crohn's disease Mother   . Heart disease Father   . Heart disease Maternal Grandfather     History  Substance Use Topics  . Smoking status: Never Smoker   . Smokeless tobacco: Never Used  . Alcohol Use: No    Allergies:  Allergies  Allergen Reactions  . Tamiflu [Oseltamivir Phosphate]     White tongue, cotton mouth    Prescriptions prior to admission  Medication Sig Dispense Refill  . Prenatal Vit-Fe Fumarate-FA (PRENATAL MULTIVITAMIN) TABS tablet Take 1 tablet by mouth daily at 12 noon.      . ranitidine (ZANTAC) 150 MG tablet Take 150 mg by mouth daily as needed for heartburn.        Review of Systems  Constitutional: Negative for fever and chills.  Gastrointestinal: Negative for nausea, vomiting and abdominal pain.  Genitourinary: Negative for dysuria, urgency, frequency and hematuria.   Neg for vaginal bleeding, loss of fluid, or decreased fetal movement.  She denies contractions.  Neurological: Negative for headaches.   Physical Exam   Blood pressure 131/77, pulse 108, temperature 98.3 F (36.8 C), temperature source Oral, resp. rate 20, height 5' 3.5" (1.613 m), weight 288 lb 6.4 oz (130.817 kg), SpO2 100.00%. Physical Exam  Constitutional: She is oriented to person, place, and time. She appears well-developed and well-nourished. No distress.  HENT:  Head: Normocephalic.  Neck: Normal range of motion.  Cardiovascular: Normal rate.   Respiratory: Effort normal.  Musculoskeletal: She exhibits edema (bilateral feet and ankles).  Neurological: She is alert and oriented to person, place, and time.  Skin: Skin is warm and dry.  Psychiatric: She has a normal mood and affect. Her behavior is normal.    Filed Vitals:   06/28/13 1745 06/28/13 1747 06/28/13 1802 06/28/13 1817  BP: 123/79 123/79 123/72 118/82  Pulse: 105 105 100 113  Temp:      TempSrc:      Resp:      Height:      Weight:      SpO2:       Results for orders placed during the hospital encounter of 06/28/13 (from the past 24 hour(s))  URINALYSIS, ROUTINE W REFLEX MICROSCOPIC     Status: Abnormal   Collection Time    06/28/13  5:00 PM  Result Value Range   Color, Urine YELLOW  YELLOW   APPearance CLEAR  CLEAR   Specific Gravity, Urine >1.030 (*) 1.005 - 1.030   pH 6.0  5.0 - 8.0   Glucose, UA NEGATIVE  NEGATIVE mg/dL   Hgb urine dipstick NEGATIVE  NEGATIVE   Bilirubin Urine NEGATIVE  NEGATIVE   Ketones, ur 15 (*) NEGATIVE mg/dL   Protein, ur NEGATIVE  NEGATIVE mg/dL   Urobilinogen, UA 0.2  0.0 - 1.0 mg/dL   Nitrite NEGATIVE  NEGATIVE   Leukocytes, UA NEGATIVE  NEGATIVE     MAU Course  Procedures  NST-  NON REACTIVE, irregular contractions q1-4 minutes.  Reported to Dr. Renaldo Fiddler  [id number]  Reported MSE, FMS findings to patient,  UA in progress.  Order given for [facility] Care turned over to J.  Vernie Ammons, [place] 1478 - Patient in radiology. Care assumed from Jeani Sow, NP [facility] 8/8 Discussed results with Dr. Renaldo Fiddler. Ok for discharge. Return to MAU tomorrow morning for NST Assessment and Plan  A: Peripheral edema Non-Reactive NST  P: Discharge home Patient advised to return to MAU tomorrow morning for NST Patient may return to MAU sooner as needed  KEY,EVE M 06/28/2013, 5:27 PM   Freddi Starr, PA-C 06/28/2013 9:06 PM

## 2013-06-28 NOTE — MAU Note (Signed)
Patient states she has increasing swelling in feet and legs for 3 days. A little swelling in hands. Took her BP today and was 158/96 and was instructed to come to MAU for evaluation. Denies contractions,bleeding or leaking and reports good fetal movement. Denies headache or visual changes.

## 2013-06-29 ENCOUNTER — Inpatient Hospital Stay (HOSPITAL_COMMUNITY)
Admission: AD | Admit: 2013-06-29 | Discharge: 2013-06-29 | Disposition: A | Discharge status: Home / Self Care | Payer: BC Managed Care – PPO | Source: Ambulatory Visit | Attending: Obstetrics and Gynecology | Admitting: Obstetrics and Gynecology

## 2013-06-29 ENCOUNTER — Encounter (HOSPITAL_COMMUNITY): Payer: Self-pay | Admitting: *Deleted

## 2013-06-29 DIAGNOSIS — O368390 Maternal care for abnormalities of the fetal heart rate or rhythm, unspecified trimester, not applicable or unspecified: Secondary | ICD-10-CM | POA: Insufficient documentation

## 2013-06-29 NOTE — MAU Note (Signed)
Patient presents for repeat NST; was seen in MAU yesterday and completed an 8/8 BPP and NST but lacked sufficient accelerations and was told to return today to repeat the test.

## 2013-07-03 ENCOUNTER — Encounter (HOSPITAL_COMMUNITY): Payer: Self-pay | Admitting: *Deleted

## 2013-07-03 ENCOUNTER — Inpatient Hospital Stay (HOSPITAL_COMMUNITY)
Admission: AD | Admit: 2013-07-03 | Discharge: 2013-07-03 | Disposition: A | Discharge status: Home / Self Care | Payer: BC Managed Care – PPO | Source: Ambulatory Visit | Attending: Obstetrics and Gynecology | Admitting: Obstetrics and Gynecology

## 2013-07-03 DIAGNOSIS — O139 Gestational [pregnancy-induced] hypertension without significant proteinuria, unspecified trimester: Secondary | ICD-10-CM

## 2013-07-03 DIAGNOSIS — O99891 Other specified diseases and conditions complicating pregnancy: Secondary | ICD-10-CM | POA: Insufficient documentation

## 2013-07-03 DIAGNOSIS — O163 Unspecified maternal hypertension, third trimester: Secondary | ICD-10-CM

## 2013-07-03 DIAGNOSIS — R030 Elevated blood-pressure reading, without diagnosis of hypertension: Secondary | ICD-10-CM | POA: Insufficient documentation

## 2013-07-03 LAB — URINALYSIS, ROUTINE W REFLEX MICROSCOPIC
Bilirubin Urine: NEGATIVE
Glucose, UA: 250 mg/dL — AB
Nitrite: NEGATIVE
Specific Gravity, Urine: >1.03 — ABNORMAL HIGH (ref 1.005–1.030)
pH: 6 (ref 5.0–8.0)

## 2013-07-03 LAB — COMPREHENSIVE METABOLIC PANEL
ALT: 13 U/L (ref 0–35)
AST: 16 U/L (ref 0–37)
Albumin: 2.3 g/dL — ABNORMAL LOW (ref 3.5–5.2)
Alkaline Phosphatase: 211 U/L — ABNORMAL HIGH (ref 39–117)
CO2: 22 mEq/L (ref 19–32)
Chloride: 101 mEq/L (ref 96–112)
Potassium: 4.3 mEq/L (ref 3.5–5.1)
Total Bilirubin: 0.3 mg/dL (ref 0.3–1.2)

## 2013-07-03 LAB — URINE MICROSCOPIC-ADD ON

## 2013-07-03 LAB — CBC
Platelets: 172 10*3/uL (ref 150–400)
RBC: 4.5 MIL/uL (ref 3.87–5.11)
RDW: 14.8 % (ref 11.5–15.5)
WBC: 9 10*3/uL (ref 4.0–10.5)

## 2013-07-03 NOTE — MAU Provider Note (Signed)
History     CSN: [id number]  Arrival date and time: 07/03/13 1627   First Provider Initiated Contact with Patient 07/03/13 1719      Chief Complaint  Patient presents with  . Hypertension    HPI  [name] is 32 y.o. G2P1 [redacted]w[redacted]d weeks presenting for PIH workup after being seen in the office today by Dr. Arelia Sneddon.  She had protein in her urine and her blood pressures were elevated. She had BPP today that as 8/8.  She was seen here last Friday and Saturday for elevated BPs and NSTs.  Continues pedal edema.  Denies blurred vision, chest pain and headache. Neg for vaginal bleeding or leaking of fluid.  + Perceived fetal movements.  She states the baby is close to 10 lbs.  Dr. Arelia Sneddon called in orders.  She has instructed to return to the office tomorrow for follow up.      Past Medical History  Diagnosis Date  . Abnormal Pap smear     Past Surgical History  Procedure Laterality Date  . Cesarean section  12/17/08  . Wisdom tooth extraction  2010    Family History  Problem Relation Age of Onset  . Crohn's disease Mother   . Heart disease Father   . Heart disease Maternal Grandfather     History  Substance Use Topics  . Smoking status: Never Smoker   . Smokeless tobacco: Never Used  . Alcohol Use: No    Allergies:  Allergies  Allergen Reactions  . Tamiflu [Oseltamivir Phosphate]     White tongue, cotton mouth    Prescriptions prior to admission  Medication Sig Dispense Refill  . Prenatal Vit-Fe Fumarate-FA (PRENATAL MULTIVITAMIN) TABS tablet Take 1 tablet by mouth daily at 12 noon.      . ranitidine (ZANTAC) 150 MG tablet Take 150 mg by mouth daily as needed for heartburn.        Review of Systems  Eyes: Negative for blurred vision.  Cardiovascular: Negative for chest pain.  Gastrointestinal: Negative for abdominal pain.  Musculoskeletal:       + for pedal edema bilaterally  Neurological: Negative for headaches.   Physical Exam   Blood pressure 134/88, pulse  107, temperature 98.6 F (37 C), temperature source Oral, resp. rate 20, SpO2 99.00%.  Physical Exam  Vitals reviewed. Constitutional: She appears well-developed and well-nourished. No distress.  HENT:  Head: Normocephalic.  GI: There is no tenderness.  Musculoskeletal: She exhibits edema (bilateral pedal edema).  Skin: Skin is warm and dry.  Psychiatric: She has a normal mood and affect. Her behavior is normal.   Results for orders placed during the hospital encounter of 07/03/13 (from the past 24 hour(s))  CBC     Status: None   Collection Time    07/03/13  5:45 PM      Result Value Range   WBC 9.0  4.0 - 10.5 K/uL   RBC 4.50  3.87 - 5.11 MIL/uL   Hemoglobin 12.3  12.0 - 15.0 g/dL   HCT 82.9  56.2 - 13.0 %   MCV 84.7  78.0 - 100.0 fL   MCH 27.3  26.0 - 34.0 pg   MCHC 32.3  30.0 - 36.0 g/dL   RDW 86.5  78.4 - 69.6 %   Platelets 172  150 - 400 K/uL  COMPREHENSIVE METABOLIC PANEL     Status: Abnormal   Collection Time    07/03/13  5:45 PM      Result Value  Range   Sodium 134 (*) 135 - 145 mEq/L   Potassium 4.3  3.5 - 5.1 mEq/L   Chloride 101  96 - 112 mEq/L   CO2 22  19 - 32 mEq/L   Glucose, Bld 156 (*) 70 - 99 mg/dL   BUN 15  6 - 23 mg/dL   Creatinine, Ser 1.61  0.50 - 1.10 mg/dL   Calcium 09.6  8.4 - 04.5 mg/dL   Total Protein 6.1  6.0 - 8.3 g/dL   Albumin 2.3 (*) 3.5 - 5.2 g/dL   AST 16  0 - 37 U/L   ALT 13  0 - 35 U/L   Alkaline Phosphatase 211 (*) 39 - 117 U/L   Total Bilirubin 0.3  0.3 - 1.2 mg/dL   GFR calc non Af Amer >90  >90 mL/min   GFR calc Af Amer >90  >90 mL/min  URIC ACID     Status: None   Collection Time    07/03/13  5:45 PM      Result Value Range   Uric Acid, Serum 4.6  2.4 - 7.0 mg/dL  LACTATE DEHYDROGENASE     Status: None   Collection Time    07/03/13  5:45 PM      Result Value Range   LDH 120  94 - 250 U/L  URINALYSIS, ROUTINE W REFLEX MICROSCOPIC     Status: Abnormal   Collection Time    07/03/13  5:50 PM      Result Value Range    Color, Urine YELLOW  YELLOW   APPearance CLEAR  CLEAR   Specific Gravity, Urine >1.030 (*) 1.005 - 1.030   pH 6.0  5.0 - 8.0   Glucose, UA 250 (*) NEGATIVE mg/dL   Hgb urine dipstick TRACE (*) NEGATIVE   Bilirubin Urine NEGATIVE  NEGATIVE   Ketones, ur NEGATIVE  NEGATIVE mg/dL   Protein, ur 30 (*) NEGATIVE mg/dL   Urobilinogen, UA 0.2  0.0 - 1.0 mg/dL   Nitrite NEGATIVE  NEGATIVE   Leukocytes, UA NEGATIVE  NEGATIVE  URINE MICROSCOPIC-ADD ON     Status: Abnormal   Collection Time    07/03/13  5:50 PM      Result Value Range   Squamous Epithelial / LPF MANY (*) RARE   WBC, UA 0-2  <3 WBC/hpf   Crystals CA OXALATE CRYSTALS (*) NEGATIVE   Filed Vitals:   07/03/13 1846 07/03/13 1901 07/03/13 1916 07/03/13 1931  BP: 146/93 142/89 135/86 134/88  Pulse: 107 108 102 107  Temp:      TempSrc:      Resp:      SpO2:       MAU Course  Procedures  MDM Labs and FMS were reported to Dr. Arelia Sneddon by Heriberto Antigua.  Order given to Discharge home with follow up in office tomorrow.  Assessment and Plan  A:  PIH ruled out--borderline elevated blood pressures at 101w2d gestation   P:  Return to office tomorrow as planned.         KEY,EVE M 07/03/2013, 8:07 PM

## 2013-07-03 NOTE — MAU Note (Signed)
Patient sent from the office for evaluation of elevated blood pressure. States she has had increasing is swelling during the day. Was better over the past weekend. Denies pain, bleeding or leaking and reports good fetal movement.

## 2013-07-09 ENCOUNTER — Encounter (HOSPITAL_COMMUNITY): Payer: Self-pay | Admitting: *Deleted

## 2013-07-09 ENCOUNTER — Encounter (HOSPITAL_COMMUNITY)
Admission: AD | Disposition: A | Discharge status: Home / Self Care | Payer: Self-pay | Source: Ambulatory Visit | Attending: Obstetrics and Gynecology

## 2013-07-09 ENCOUNTER — Inpatient Hospital Stay (HOSPITAL_COMMUNITY)
Admission: AD | Admit: 2013-07-09 | Discharge: 2013-07-13 | DRG: 765 | Disposition: A | Discharge status: Home / Self Care | Payer: BC Managed Care – PPO | Source: Ambulatory Visit | Attending: Obstetrics and Gynecology | Admitting: Obstetrics and Gynecology

## 2013-07-09 ENCOUNTER — Inpatient Hospital Stay (HOSPITAL_COMMUNITY): Payer: BC Managed Care – PPO | Admitting: Anesthesiology

## 2013-07-09 ENCOUNTER — Encounter (HOSPITAL_COMMUNITY): Payer: BC Managed Care – PPO | Admitting: Anesthesiology

## 2013-07-09 DIAGNOSIS — O4290 Premature rupture of membranes, unspecified as to length of time between rupture and onset of labor, unspecified weeks of gestation: Principal | ICD-10-CM | POA: Diagnosis present

## 2013-07-09 DIAGNOSIS — E669 Obesity, unspecified: Secondary | ICD-10-CM | POA: Diagnosis present

## 2013-07-09 DIAGNOSIS — O99892 Other specified diseases and conditions complicating childbirth: Secondary | ICD-10-CM | POA: Diagnosis present

## 2013-07-09 DIAGNOSIS — Z22330 Carrier of Group B streptococcus: Secondary | ICD-10-CM

## 2013-07-09 DIAGNOSIS — Z6841 Body Mass Index (BMI) 40.0 and over, adult: Secondary | ICD-10-CM

## 2013-07-09 DIAGNOSIS — O34219 Maternal care for unspecified type scar from previous cesarean delivery: Secondary | ICD-10-CM | POA: Diagnosis present

## 2013-07-09 HISTORY — DX: Major depressive disorder, single episode, unspecified: F32.9

## 2013-07-09 HISTORY — DX: Depression, unspecified: F32.A

## 2013-07-09 HISTORY — DX: Obesity, unspecified: E66.9

## 2013-07-09 HISTORY — DX: Gestational diabetes mellitus in pregnancy, unspecified control: O24.419

## 2013-07-09 LAB — CBC
HCT: 38.3 % (ref 36.0–46.0)
Hemoglobin: 12.7 g/dL (ref 12.0–15.0)
MCH: 28.2 pg (ref 26.0–34.0)
MCHC: 33.2 g/dL (ref 30.0–36.0)
RDW: 15.5 % (ref 11.5–15.5)

## 2013-07-09 LAB — GLUCOSE, CAPILLARY

## 2013-07-09 LAB — TYPE AND SCREEN

## 2013-07-09 SURGERY — Surgical Case
Anesthesia: Spinal | Site: Abdomen | Wound class: Clean Contaminated

## 2013-07-09 MED ORDER — MENTHOL 3 MG MT LOZG: 1.0000 | LOZENGE | OROMUCOSAL | Status: DC | PRN

## 2013-07-09 MED ORDER — ZOLPIDEM TARTRATE 5 MG PO TABS: 5.0000 mg | ORAL_TABLET | Freq: Every evening | ORAL | Status: DC | PRN

## 2013-07-09 MED ORDER — EPHEDRINE 5 MG/ML INJ
INTRAVENOUS | Status: AC
  Filled 2013-07-09: qty 10

## 2013-07-09 MED ORDER — SODIUM CHLORIDE 0.9 % IJ SOLN: 3.0000 mL | INTRAMUSCULAR | Status: DC | PRN

## 2013-07-09 MED ORDER — MEPERIDINE HCL 25 MG/ML IJ SOLN: 6.2500 mg | INTRAMUSCULAR | Status: DC | PRN

## 2013-07-09 MED ORDER — SIMETHICONE 80 MG PO CHEW: 80.0000 mg | CHEWABLE_TABLET | ORAL | Status: DC | PRN

## 2013-07-09 MED ORDER — ONDANSETRON HCL 4 MG PO TABS: 4.0000 mg | ORAL_TABLET | ORAL | Status: DC | PRN

## 2013-07-09 MED ORDER — FENTANYL CITRATE 0.05 MG/ML IJ SOLN
INTRAMUSCULAR | Status: AC
  Administered 2013-07-09: 25 ug via INTRAVENOUS
  Filled 2013-07-09: qty 2

## 2013-07-09 MED ORDER — OXYCODONE-ACETAMINOPHEN 5-325 MG PO TABS
1.0000 | ORAL_TABLET | ORAL | Status: DC | PRN
  Administered 2013-07-10 (×3): 1 via ORAL
  Administered 2013-07-10 – 2013-07-11 (×6): 2 via ORAL
  Administered 2013-07-11 – 2013-07-12 (×2): 1 via ORAL
  Administered 2013-07-12 (×2): 2 via ORAL
  Administered 2013-07-12: 1 via ORAL
  Administered 2013-07-12 – 2013-07-13 (×3): 2 via ORAL
  Filled 2013-07-09: qty 1
  Filled 2013-07-09 (×2): qty 2
  Filled 2013-07-09: qty 1
  Filled 2013-07-09 (×3): qty 2
  Filled 2013-07-09: qty 1
  Filled 2013-07-09: qty 2
  Filled 2013-07-09: qty 1
  Filled 2013-07-09: qty 2
  Filled 2013-07-09: qty 1
  Filled 2013-07-09 (×5): qty 2

## 2013-07-09 MED ORDER — DEXTROSE 5 % IV SOLN
1.0000 ug/kg/h | INTRAVENOUS | Status: DC | PRN
  Filled 2013-07-09: qty 2

## 2013-07-09 MED ORDER — NALBUPHINE SYRINGE 5 MG/0.5 ML
INJECTION | INTRAMUSCULAR | Status: AC
  Administered 2013-07-09: 10 mg via SUBCUTANEOUS
  Filled 2013-07-09: qty 1

## 2013-07-09 MED ORDER — KETOROLAC TROMETHAMINE 30 MG/ML IJ SOLN
INTRAMUSCULAR | Status: AC
  Administered 2013-07-09: 30 mg via INTRAVENOUS
  Filled 2013-07-09: qty 1

## 2013-07-09 MED ORDER — PHENYLEPHRINE 40 MCG/ML (10ML) SYRINGE FOR IV PUSH (FOR BLOOD PRESSURE SUPPORT)
PREFILLED_SYRINGE | INTRAVENOUS | Status: AC
  Filled 2013-07-09: qty 5

## 2013-07-09 MED ORDER — ONDANSETRON HCL 4 MG/2ML IJ SOLN: 4.0000 mg | INTRAMUSCULAR | Status: DC | PRN

## 2013-07-09 MED ORDER — LIDOCAINE-PRILOCAINE 2.5-2.5 % EX CREA
TOPICAL_CREAM | CUTANEOUS | Status: AC
  Filled 2013-07-09: qty 30

## 2013-07-09 MED ORDER — FENTANYL CITRATE 0.05 MG/ML IJ SOLN
INTRAMUSCULAR | Status: AC
  Filled 2013-07-09: qty 2

## 2013-07-09 MED ORDER — DEXTROSE 5 % IV SOLN: 3.0000 g | INTRAVENOUS | Status: DC

## 2013-07-09 MED ORDER — KETOROLAC TROMETHAMINE 30 MG/ML IJ SOLN
30.0000 mg | Freq: Four times a day (QID) | INTRAMUSCULAR | Status: AC | PRN
  Administered 2013-07-09 (×2): 30 mg via INTRAVENOUS
  Filled 2013-07-09: qty 1

## 2013-07-09 MED ORDER — LACTATED RINGERS IV SOLN
INTRAVENOUS | Status: DC | PRN
  Administered 2013-07-09: 10:00:00 via INTRAVENOUS

## 2013-07-09 MED ORDER — MEPERIDINE HCL 25 MG/ML IJ SOLN
INTRAMUSCULAR | Status: AC
  Filled 2013-07-09: qty 1

## 2013-07-09 MED ORDER — MIDAZOLAM HCL 2 MG/2ML IJ SOLN: 0.5000 mg | Freq: Once | INTRAMUSCULAR | Status: DC | PRN

## 2013-07-09 MED ORDER — OXYTOCIN 10 UNIT/ML IJ SOLN
INTRAMUSCULAR | Status: AC
  Filled 2013-07-09: qty 3

## 2013-07-09 MED ORDER — MEPERIDINE HCL 25 MG/ML IJ SOLN
INTRAMUSCULAR | Status: DC | PRN
  Administered 2013-07-09 (×2): 12.5 mg via INTRAVENOUS

## 2013-07-09 MED ORDER — PHENYLEPHRINE HCL 10 MG/ML IJ SOLN
INTRAMUSCULAR | Status: DC | PRN
  Administered 2013-07-09: 40 ug via INTRAVENOUS

## 2013-07-09 MED ORDER — DIPHENHYDRAMINE HCL 50 MG/ML IJ SOLN: 12.5000 mg | INTRAMUSCULAR | Status: DC | PRN

## 2013-07-09 MED ORDER — PRENATAL MULTIVITAMIN CH
1.0000 | ORAL_TABLET | Freq: Every day | ORAL | Status: DC
  Administered 2013-07-10 – 2013-07-13 (×4): 1 via ORAL
  Filled 2013-07-09 (×4): qty 1

## 2013-07-09 MED ORDER — MORPHINE SULFATE (PF) 0.5 MG/ML IJ SOLN
INTRAMUSCULAR | Status: DC | PRN
  Administered 2013-07-09: .15 mg via INTRATHECAL

## 2013-07-09 MED ORDER — PROMETHAZINE HCL 25 MG/ML IJ SOLN: 6.2500 mg | INTRAMUSCULAR | Status: DC | PRN

## 2013-07-09 MED ORDER — CITRIC ACID-SODIUM CITRATE 334-500 MG/5ML PO SOLN
30.0000 mL | Freq: Once | ORAL | Status: AC
  Administered 2013-07-09: 30 mL via ORAL
  Filled 2013-07-09: qty 15

## 2013-07-09 MED ORDER — IBUPROFEN 600 MG PO TABS
600.0000 mg | ORAL_TABLET | Freq: Four times a day (QID) | ORAL | Status: DC
  Administered 2013-07-10 – 2013-07-13 (×15): 600 mg via ORAL
  Filled 2013-07-09 (×15): qty 1

## 2013-07-09 MED ORDER — TETANUS-DIPHTH-ACELL PERTUSSIS 5-2.5-18.5 LF-MCG/0.5 IM SUSP: 0.5000 mL | Freq: Once | INTRAMUSCULAR | Status: DC

## 2013-07-09 MED ORDER — SENNOSIDES-DOCUSATE SODIUM 8.6-50 MG PO TABS
2.0000 | ORAL_TABLET | ORAL | Status: DC
  Administered 2013-07-10 – 2013-07-12 (×4): 2 via ORAL
  Filled 2013-07-09 (×4): qty 2

## 2013-07-09 MED ORDER — KETOROLAC TROMETHAMINE 30 MG/ML IJ SOLN: 30.0000 mg | Freq: Four times a day (QID) | INTRAMUSCULAR | Status: AC | PRN

## 2013-07-09 MED ORDER — SCOPOLAMINE 1 MG/3DAYS TD PT72
MEDICATED_PATCH | TRANSDERMAL | Status: AC
  Administered 2013-07-09: 1.5 mg via TRANSDERMAL
  Filled 2013-07-09: qty 1

## 2013-07-09 MED ORDER — LANOLIN HYDROUS EX OINT
1.0000 | TOPICAL_OINTMENT | CUTANEOUS | Status: DC | PRN
Start: 2013-07-09 — End: 2013-07-13

## 2013-07-09 MED ORDER — FAMOTIDINE IN NACL 20-0.9 MG/50ML-% IV SOLN
20.0000 mg | Freq: Once | INTRAVENOUS | Status: AC
  Administered 2013-07-09: 20 mg via INTRAVENOUS
  Filled 2013-07-09: qty 50

## 2013-07-09 MED ORDER — ONDANSETRON HCL 4 MG/2ML IJ SOLN: 4.0000 mg | Freq: Three times a day (TID) | INTRAMUSCULAR | Status: DC | PRN

## 2013-07-09 MED ORDER — ONDANSETRON HCL 4 MG/2ML IJ SOLN
INTRAMUSCULAR | Status: AC
  Filled 2013-07-09: qty 2

## 2013-07-09 MED ORDER — OXYTOCIN 10 UNIT/ML IJ SOLN
INTRAMUSCULAR | Status: AC
  Filled 2013-07-09: qty 1

## 2013-07-09 MED ORDER — FENTANYL CITRATE 0.05 MG/ML IJ SOLN
25.0000 ug | INTRAMUSCULAR | Status: DC | PRN
  Administered 2013-07-09: 50 ug via INTRAVENOUS
  Administered 2013-07-09: 25 ug via INTRAVENOUS

## 2013-07-09 MED ORDER — LACTATED RINGERS IV SOLN
INTRAVENOUS | Status: DC
  Administered 2013-07-10: 01:00:00 via INTRAVENOUS

## 2013-07-09 MED ORDER — OXYTOCIN 40 UNITS IN LACTATED RINGERS INFUSION - SIMPLE MED: 62.5000 mL/h | INTRAVENOUS | Status: AC

## 2013-07-09 MED ORDER — SCOPOLAMINE 1 MG/3DAYS TD PT72
1.0000 | MEDICATED_PATCH | Freq: Once | TRANSDERMAL | Status: AC
  Administered 2013-07-09: 1.5 mg via TRANSDERMAL

## 2013-07-09 MED ORDER — NALBUPHINE HCL 10 MG/ML IJ SOLN
5.0000 mg | INTRAMUSCULAR | Status: DC | PRN
  Administered 2013-07-09: 10 mg via SUBCUTANEOUS
  Filled 2013-07-09: qty 1

## 2013-07-09 MED ORDER — PHENYLEPHRINE 8 MG IN D5W 100 ML (0.08MG/ML) PREMIX OPTIME
INJECTION | INTRAVENOUS | Status: DC | PRN
  Administered 2013-07-09: 80 ug/min via INTRAVENOUS

## 2013-07-09 MED ORDER — DIPHENHYDRAMINE HCL 50 MG/ML IJ SOLN: 25.0000 mg | INTRAMUSCULAR | Status: DC | PRN

## 2013-07-09 MED ORDER — DIPHENHYDRAMINE HCL 25 MG PO CAPS
25.0000 mg | ORAL_CAPSULE | ORAL | Status: DC | PRN
  Filled 2013-07-09: qty 1

## 2013-07-09 MED ORDER — WITCH HAZEL-GLYCERIN EX PADS: 1.0000 "application " | MEDICATED_PAD | CUTANEOUS | Status: DC | PRN

## 2013-07-09 MED ORDER — OXYTOCIN 10 UNIT/ML IJ SOLN
40.0000 [IU] | INTRAVENOUS | Status: DC | PRN
  Administered 2013-07-09: 40 [IU] via INTRAVENOUS

## 2013-07-09 MED ORDER — METOCLOPRAMIDE HCL 5 MG/ML IJ SOLN: 10.0000 mg | Freq: Three times a day (TID) | INTRAMUSCULAR | Status: DC | PRN

## 2013-07-09 MED ORDER — MORPHINE SULFATE 0.5 MG/ML IJ SOLN
INTRAMUSCULAR | Status: AC
  Filled 2013-07-09: qty 10

## 2013-07-09 MED ORDER — DIBUCAINE 1 % RE OINT: 1.0000 "application " | TOPICAL_OINTMENT | RECTAL | Status: DC | PRN

## 2013-07-09 MED ORDER — DEXTROSE IN LACTATED RINGERS 5 % IV SOLN
INTRAVENOUS | Status: DC
  Administered 2013-07-09: 21:00:00 via INTRAVENOUS

## 2013-07-09 MED ORDER — ONDANSETRON HCL 4 MG/2ML IJ SOLN
INTRAMUSCULAR | Status: DC | PRN
  Administered 2013-07-09: 4 mg via INTRAVENOUS

## 2013-07-09 MED ORDER — BUPIVACAINE IN DEXTROSE 0.75-8.25 % IT SOLN
INTRATHECAL | Status: DC | PRN
  Administered 2013-07-09: 1.6 mL via INTRATHECAL

## 2013-07-09 MED ORDER — DIPHENHYDRAMINE HCL 25 MG PO CAPS: 25.0000 mg | ORAL_CAPSULE | Freq: Four times a day (QID) | ORAL | Status: DC | PRN

## 2013-07-09 MED ORDER — SIMETHICONE 80 MG PO CHEW
80.0000 mg | CHEWABLE_TABLET | ORAL | Status: DC
  Administered 2013-07-10 – 2013-07-11 (×2): 80 mg via ORAL
  Filled 2013-07-09 (×3): qty 1

## 2013-07-09 MED ORDER — PHENYLEPHRINE HCL 10 MG/ML IJ SOLN
INTRAMUSCULAR | Status: AC
  Filled 2013-07-09: qty 1

## 2013-07-09 MED ORDER — LACTATED RINGERS IV SOLN
INTRAVENOUS | Status: DC
  Administered 2013-07-09 (×3): via INTRAVENOUS

## 2013-07-09 MED ORDER — DEXTROSE 5 % IV SOLN
3.0000 g | Freq: Once | INTRAVENOUS | Status: AC
  Administered 2013-07-09: 3 g via INTRAVENOUS
  Filled 2013-07-09: qty 3000

## 2013-07-09 MED ORDER — NALOXONE HCL 0.4 MG/ML IJ SOLN: 0.4000 mg | INTRAMUSCULAR | Status: DC | PRN

## 2013-07-09 MED ORDER — FENTANYL CITRATE 0.05 MG/ML IJ SOLN
INTRAMUSCULAR | Status: DC | PRN
  Administered 2013-07-09: 25 ug via INTRATHECAL

## 2013-07-09 MED ORDER — NALBUPHINE HCL 10 MG/ML IJ SOLN
5.0000 mg | INTRAMUSCULAR | Status: DC | PRN
  Filled 2013-07-09: qty 1

## 2013-07-09 MED ORDER — SIMETHICONE 80 MG PO CHEW
80.0000 mg | CHEWABLE_TABLET | Freq: Three times a day (TID) | ORAL | Status: DC
  Administered 2013-07-10 – 2013-07-12 (×7): 80 mg via ORAL
  Filled 2013-07-09 (×7): qty 1

## 2013-07-09 SURGICAL SUPPLY — 32 items
CLAMP CORD UMBIL (MISCELLANEOUS) IMPLANT
CLOTH BEACON ORANGE TIMEOUT ST (SAFETY) ×2 IMPLANT
DERMABOND ADVANCED (GAUZE/BANDAGES/DRESSINGS)
DERMABOND ADVANCED .7 DNX12 (GAUZE/BANDAGES/DRESSINGS) IMPLANT
DRAPE LG THREE QUARTER DISP (DRAPES) ×4 IMPLANT
DRSG OPSITE POSTOP 4X10 (GAUZE/BANDAGES/DRESSINGS) ×2 IMPLANT
DURAPREP 26ML APPLICATOR (WOUND CARE) ×2 IMPLANT
ELECT REM PT RETURN 9FT ADLT (ELECTROSURGICAL) ×2
ELECTRODE REM PT RTRN 9FT ADLT (ELECTROSURGICAL) ×1 IMPLANT
EXTRACTOR VACUUM KIWI (MISCELLANEOUS) IMPLANT
EXTRACTOR VACUUM M CUP 4 TUBE (SUCTIONS) IMPLANT
GLOVE BIO SURGEON STRL SZ 6 (GLOVE) ×2 IMPLANT
GLOVE BIOGEL PI IND STRL 6 (GLOVE) ×2 IMPLANT
GLOVE BIOGEL PI INDICATOR 6 (GLOVE) ×2
GOWN STRL REIN XL XLG (GOWN DISPOSABLE) ×6 IMPLANT
KIT ABG SYR 3ML LUER SLIP (SYRINGE) ×2 IMPLANT
NEEDLE HYPO 25X5/8 SAFETYGLIDE (NEEDLE) ×2 IMPLANT
NS IRRIG 1000ML POUR BTL (IV SOLUTION) ×2 IMPLANT
PACK C SECTION WH (CUSTOM PROCEDURE TRAY) ×2 IMPLANT
PAD OB MATERNITY 4.3X12.25 (PERSONAL CARE ITEMS) ×2 IMPLANT
STAPLER VISISTAT 35W (STAPLE) ×2 IMPLANT
SUT CHROMIC 0 CTX 36 (SUTURE) ×6 IMPLANT
SUT CHROMIC 2 0 SH (SUTURE) ×2 IMPLANT
SUT MON AB 2-0 CT1 27 (SUTURE) ×2 IMPLANT
SUT PDS AB 0 CT1 27 (SUTURE) IMPLANT
SUT PLAIN 0 NONE (SUTURE) IMPLANT
SUT PLAIN 2 0 XLH (SUTURE) ×2 IMPLANT
SUT VIC AB 0 CT1 36 (SUTURE) IMPLANT
SUT VIC AB 4-0 KS 27 (SUTURE) IMPLANT
TOWEL OR 17X24 6PK STRL BLUE (TOWEL DISPOSABLE) ×2 IMPLANT
TRAY FOLEY CATH 14FR (SET/KITS/TRAYS/PACK) ×2 IMPLANT
WATER STERILE IRR 1000ML POUR (IV SOLUTION) ×2 IMPLANT

## 2013-07-09 NOTE — Addendum Note (Signed)
Addendum created 07/09/13 1336 by Jhonnie Garner, CRNA   Modules edited: Anesthesia Flowsheet

## 2013-07-09 NOTE — H&P (Signed)
[name] is a 32 y.o. female presenting for PROM at 0620.  CTX followed.  Previous C/S with desire for repeat.  Antepartum course complicated by obesity (BMI 50), GBS positive.  NPO after midnight.  Maternal Medical History:  Reason for admission: Rupture of membranes.   Contractions: Onset was 1-2 hours ago.   Frequency: irregular.   Perceived severity is mild.    Fetal activity: Perceived fetal activity is normal.   Last perceived fetal movement was within the past hour.    Prenatal complications: no prenatal complications Prenatal Complications - Diabetes: none.    OB History   Grav Para Term Preterm Abortions TAB SAB Ect Mult Living   2 1        1      Past Medical History  Diagnosis Date  . Abnormal Pap smear   . Obesity   . Gestational diabetes    Past Surgical History  Procedure Laterality Date  . Cesarean section  12/17/08  . Wisdom tooth extraction  2010   Family History: family history includes Crohn's disease in her mother; Heart disease in her father and maternal grandfather. Social History:  reports that she has never smoked. She has never used smokeless tobacco. She reports that she does not drink alcohol or use illicit drugs.   Prenatal Transfer Tool  Maternal Diabetes: No Genetic Screening: Normal Maternal Ultrasounds/Referrals: Normal Fetal Ultrasounds or other Referrals:  None Maternal Substance Abuse:  No Significant Maternal Medications:  None Significant Maternal Lab Results:  Lab values include: Group B Strep positive Other Comments:  None  ROS  Dilation: Fingertip Effacement (%): Thick Station: Ballotable Exam by:: Sarajane Marek, RNC Blood pressure 144/104, pulse 121, temperature 97.8 F (36.6 C), temperature source Oral, resp. rate 20, height 5\' 4"  (1.626 m), weight 293 lb (132.904 kg), SpO2 100.00%. Maternal Exam:  Uterine Assessment: Contraction strength is mild.  Contraction frequency is irregular.   Abdomen: Patient reports no  abdominal tenderness. Surgical scars: low transverse.   Fundal height is unable to assess secondary to body habitus.   Estimated fetal weight is 8#.   Fetal presentation: vertex     Physical Exam  Constitutional: She is oriented to person, place, and time. She appears well-developed and well-nourished.  GI: Soft. There is no rebound and no guarding.  Neurological: She is alert and oriented to person, place, and time.  Skin: Skin is warm and dry.  Psychiatric: She has a normal mood and affect. Her behavior is normal.    Prenatal labs: ABO, Rh:   Antibody:   Rubella:   RPR:    HBsAg:    HIV:    GBS:     Assessment/Plan: 32yo G2P1001 at [redacted]w[redacted]d with PROM, previous C/S -Repeat C/S   Rayola Everhart 07/09/2013, 8:01 AM

## 2013-07-09 NOTE — Progress Notes (Signed)
Lab states will be at least 30 more minutes for T&S result. OR Kendall notified. Patient informed.

## 2013-07-09 NOTE — Lactation Note (Signed)
This note was copied from the chart of Laurie Jinger [place]. Lactation Consultation Note    Initial consult with this mom of a NICU baby, 37 1/[redacted] weeks gestation, 4 hours old, weighing 12 pounds 14 ounces. The baby had breathing problems at birth, and was placed on CPAP + 5 60% oxygen in the nICU. Mom pumped for 8 months with her first baby, also born 2 1/2 qweeks early. Mom wants to be able to latch this baby, and I assusred  Her I would help her with latching Maddy, both in the NICU and in O/P lactation. I briefly reviewed DEP teaching with mom, lactation services, but mom was very sleepy. I was able to hand express a small drop of colsotrum prior to pumping, but non after. I adised mom to sleep, and pump if she feels up to it. I will follow up with this mom tomorrow. She knows to call for questions/concerns. She will rent a DEP and then order one from her insurance co.  Patient Name: [name] [id number] Date: 07/09/2013 Reason for consult: Initial assessment;NICU baby   Maternal Data Formula Feeding for Exclusion: Yes (baby in NICU) Infant to breast within first hour of birth: No Breastfeeding delayed due to:: Infant status Has patient been taught Hand Expression?: Yes Does the patient have breastfeeding experience prior to this delivery?: Yes  Feeding    LATCH Score/Interventions                      Lactation Tools Discussed/Used Tools: Pump Breast pump type: Double-Electric Breast Pump WIC Program: No Pump Review: Setup, frequency, and cleaning;Milk Storage;Other (comment) (hand exp. NICU booklet on EBM) Initiated by:: c Chun Sellen aqt 4 hours post partum Date initiated:: 07/09/13   Consult Status Consult Status: Follow-up Date: 07/10/13 Follow-up type: In-patient    [name] 07/09/2013, 2:51 PM

## 2013-07-09 NOTE — MAU Note (Signed)
PT SAYS  SROM AT 0620-  HAS [facility] REPEAT C/S FOR 10-28 .      UC STARTED AFTER SROM.   DENIES HSV AND MRSA.

## 2013-07-09 NOTE — Progress Notes (Signed)
Patient counseled for repeat C/S including risk of bleeding possibly requiring blood transfusion or life-saving C-hyst, infection, damage to surrounding structures including the fetus.  She understands the implications in future pregnancies.  All questions were answered.  The patient wishes to proceed.

## 2013-07-09 NOTE — Op Note (Signed)
[name] PROCEDURE DATE: 07/09/2013  PREOPERATIVE DIAGNOSIS: Intrauterine pregnancy at  [redacted]w[redacted]d weeks gestation with PROM, previous C/S  POSTOPERATIVE DIAGNOSIS: The same  PROCEDURE: Repeat Low Transverse Cesarean Section  SURGEON:  Dr. Mitchel Honour   INDICATIONS: [name] is a 32 y.o. G2P1002 at [redacted]w[redacted]d scheduled for cesarean section secondary to PROM and history of previous C/S.  The risks of cesarean section discussed with the patient included but were not limited to: bleeding which may require transfusion or reoperation; infection which may require antibiotics; injury to bowel, bladder, ureters or other surrounding organs; injury to the fetus; need for additional procedures including hysterectomy in the event of a life-threatening hemorrhage; placental abnormalities wth subsequent pregnancies, incisional problems, thromboembolic phenomenon and other postoperative/anesthesia complications. The patient concurred with the proposed plan, giving informed written consent for the procedure.    FINDINGS:  Viable female infant in cephalic presentation, APGAR 2,7,8:  Weight 12 pounds 14 ounces. Clear amniotic fluid.  Intact placenta, three vessel cord.  Grossly normal uterus, ovaries and fallopian tubes. .   ANESTHESIA:    Epidural ESTIMATED BLOOD LOSS:  850 ml SPECIMENS: Placenta sent to pathology COMPLICATIONS: None immediate  PROCEDURE IN DETAIL:  The patient received intravenous antibiotics and had sequential compression devices applied to her lower extremities while in the preoperative area.  She was then taken to the operating room where spinal anesthesia was administered and was found to be adequate. She was then placed in a dorsal supine position with a leftward tilt.  The abdominal panus was taped up to allow easy access to the lower abdomen and the abdomen was prepped and draped in a sterile manner.  A foley catheter was placed into her bladder and attached to constant gravity.  After an  adequate timeout was performed, a Pfannenstiel skin incision was made with scalpel and carried through to the underlying layer of fascia. The fascia was incised in the midline and this incision was extended bilaterally using the Mayo scissors. Kocher clamps were applied to the superior aspect of the fascial incision and the underlying rectus muscles were dissected off bluntly. A similar process was carried out on the inferior aspect of the facial incision. The rectus muscles were separated in the midline bluntly and the peritoneum was entered bluntly.   A bladder flap was created sharply and developed bluntly.  The bladder was protected behind the bladder blade.  A transverse hysterotomy was made with a scalpel and extended bilaterally bluntly. The bladder blade was then removed. The infant was successfully delivered, and cord was clamped and cut and infant was handed over to awaiting neonatology team. Uterine massage was then administered and the placenta delivered intact with three-vessel cord. The uterus was cleared of clot and debris.  The hysterotomy was closed with #1 Chromic.  A second imbricating suture of #1 Chromic was used to reinforce the incision and aid in hemostasis.  The peritoneum and rectus muscles were noted to be hemostatic and were reapproximated using 2-0 monocryl.  The fascia was closed with PDS in a running fashion with good restoration of anatomy.  The subcutaneus tissue was copiously irrigated and reapproximated using plain gut suture.  The skin was closed with staples.  Pt tolerated the procedure will.  All counts were correct x2.  Pt went to the recovery room in stable condition.

## 2013-07-09 NOTE — Anesthesia Preprocedure Evaluation (Signed)
Anesthesia Evaluation  Patient identified by MRN, date of birth, ID band Patient awake    Reviewed: Allergy & Precautions, H&P , NPO status , Patient's Chart, lab work & pertinent test results  Airway Mallampati: IV      Dental no notable dental hx.    Pulmonary neg pulmonary ROS,  breath sounds clear to auscultation  Pulmonary exam normal       Cardiovascular Exercise Tolerance: Good negative cardio ROS  Rhythm:regular Rate:Normal     Neuro/Psych negative neurological ROS  negative psych ROS   GI/Hepatic negative GI ROS, Neg liver ROS,   Endo/Other  negative endocrine ROSdiabetesMorbid obesity  Renal/GU negative Renal ROS  negative genitourinary   Musculoskeletal   Abdominal Normal abdominal exam  (+)   Peds  Hematology negative hematology ROS (+)   Anesthesia Other Findings   Reproductive/Obstetrics (+) Pregnancy                           Anesthesia Physical Anesthesia Plan  ASA: III and emergent  Anesthesia Plan: Spinal   Post-op Pain Management:    Induction:   Airway Management Planned:   Additional Equipment:   Intra-op Plan:   Post-operative Plan:   Informed Consent: I have reviewed the patients History and Physical, chart, labs and discussed the procedure including the risks, benefits and alternatives for the proposed anesthesia with the patient or authorized representative who has indicated his/her understanding and acceptance.     Plan Discussed with: Anesthesiologist, CRNA and Surgeon  Anesthesia Plan Comments:         Anesthesia Quick Evaluation

## 2013-07-09 NOTE — Consult Note (Signed)
Neonatology Note:  Attendance at C-section:  I was asked by Dr. Morris to attend this repeat C/S at 37 1/7 weeks following SROM. The mother is a G2P1 O pos, GBS pos with borderline blood glucose, not specifically diagnosed with GDM. She did have GDM with her previous pregnancy. ROM 4 hours prior to delivery, fluid clear. Mother did not receive antibiotics prior to delivery. The baseline FHR was flat but there were no FHR decelerations on tracing prior to C/S. Infant was floppy, blue, and apneic at delivery. We bulb suctioned a moderate amount of clear mucous from the nares and mouth and gave vigorous stimulation, but she only took one small gasp and had a HR of 50, so PPV was applied. We continued PPV for 1.5 minutes, with improvement in color and HR. At about 2.5 minutes, she began to breathe on her own, with intermittent weak cries. Breath sounds were coarse. We performed chest PT and did DeLee suctioning for removal of 5 ml clear fluids, but without much improvement in breath sounds. We had placed a pulse oximeter at about 6 minutes of life, which was 60% in room air, so we gave BBO2. The O2 saturations came up to as high as 90%, but never higher, and would drop quickly if the BBO2 was not close to her face. She began to have subcostal retractions and audible grunting by 10-12 min. Ap 2/7/8. Due to the above respiratory distress, she was only viewed briefly by her mother in the OR, then was transported to the NICU in 100% BBO2 for further care. Her father was in attendance.  Mahlet Jergens C. Aquil Duhe, MD  

## 2013-07-09 NOTE — Transfer of Care (Signed)
Immediate Anesthesia Transfer of Care Note  Patient: [name]  Procedure(s) Performed: Procedure(s): Repeat CESAREAN SECTION with delivery of baby     @     ;Apgars  (N/A)  Patient Location: PACU  Anesthesia Type:Spinal  Level of Consciousness: awake, alert  and oriented  Airway & Oxygen Therapy: Patient Spontanous Breathing  Post-op Assessment: Report given to PACU RN and Post -op Vital signs reviewed and stable  Post vital signs: Reviewed and stable  Complications: No apparent anesthesia complications

## 2013-07-09 NOTE — Anesthesia Procedure Notes (Signed)
Spinal  Patient location during procedure: OR Start time: 07/09/2013 9:54 AM Staffing Anesthesiologist: Angus Seller., Harrell Gave. Performed by: anesthesiologist  Preanesthetic Checklist Completed: patient identified, site marked, surgical consent, pre-op evaluation, timeout performed, IV checked, risks and benefits discussed and monitors and equipment checked Spinal Block Patient position: sitting Prep: DuraPrep Patient monitoring: heart rate, cardiac monitor, continuous pulse ox and blood pressure Approach: midline Location: L3-4 Injection technique: single-shot Needle Needle type: Sprotte  Needle gauge: 24 G Needle length: 9 cm Assessment Sensory level: T4 Additional Notes Patient identified.  Risk benefits discussed including failed block, incomplete pain control, headache, nerve damage, paralysis, blood pressure changes, nausea, vomiting, reactions to medication both toxic or allergic, and postpartum back pain.  Patient expressed understanding and wished to proceed.  All questions were answered.  Sterile technique used throughout procedure.  CSF was clear.  No parasthesia or other complications.  Please see nursing notes for vital signs.

## 2013-07-09 NOTE — Progress Notes (Signed)
Results returned today for 24 hour urine at 900mg  protein.  Patient is informed of this which does diagnose her with Pre-Eclampsia in light of her elevated blood pressures.  She denies HA, CP/SOB, RUQ pain, and visual disturbance at any point in this pregnancy or currently.  Her BPs have been normal to mild range post-operatively.  Patient is encouraged to call out if she develops symptoms of Pre-Eclampsia and we will Mag postpartum.  Also, if she develops severe range BPs, we will Mag.  Patient's questions were answered.  Will get HELLP labs in AM.

## 2013-07-09 NOTE — Preoperative (Signed)
Beta Blockers   Reason not to administer Beta Blockers:Not Applicable 

## 2013-07-09 NOTE — Anesthesia Preprocedure Evaluation (Addendum)
Anesthesia Evaluation  Patient identified by MRN, date of birth, ID band Patient awake    Reviewed: Allergy & Precautions, H&P , NPO status , Patient's Chart, lab work & pertinent test results  Airway Mallampati: IV      Dental no notable dental hx.    Pulmonary neg pulmonary ROS,  breath sounds clear to auscultation  Pulmonary exam normal       Cardiovascular Exercise Tolerance: Good negative cardio ROS  Rhythm:regular Rate:Normal     Neuro/Psych negative neurological ROS  negative psych ROS   GI/Hepatic negative GI ROS, Neg liver ROS,   Endo/Other  negative endocrine ROSdiabetesMorbid obesity  Renal/GU negative Renal ROS  negative genitourinary   Musculoskeletal   Abdominal Normal abdominal exam  (+)   Peds  Hematology negative hematology ROS (+)   Anesthesia Other Findings   Reproductive/Obstetrics (+) Pregnancy                           Anesthesia Physical  Anesthesia Plan  ASA: III and emergent  Anesthesia Plan: Spinal   Post-op Pain Management:    Induction:   Airway Management Planned:   Additional Equipment:   Intra-op Plan:   Post-operative Plan:   Informed Consent: I have reviewed the patients History and Physical, chart, labs and discussed the procedure including the risks, benefits and alternatives for the proposed anesthesia with the patient or authorized representative who has indicated his/her understanding and acceptance.     Plan Discussed with: Anesthesiologist, CRNA and Surgeon  Anesthesia Plan Comments:        fatty liver disease Anesthesia Quick Evaluation

## 2013-07-09 NOTE — Anesthesia Postprocedure Evaluation (Signed)
  Anesthesia Post Note  Patient: [name]  Procedure(s) Performed: Procedure(s) (LRB): Repeat CESAREAN SECTION with delivery of baby girl @ 1016    ;Apgars 2/7/9 (N/A)  Anesthesia type: Spinal  Patient location: PACU  Post pain: Pain level controlled  Post assessment: Post-op Vital signs reviewed  Last Vitals:  Filed Vitals:   07/09/13 1215  BP: 136/65  Pulse: 72  Temp:   Resp:     Post vital signs: Reviewed  Level of consciousness: awake  Complications: No apparent anesthesia complications

## 2013-07-10 ENCOUNTER — Encounter (HOSPITAL_COMMUNITY): Payer: Self-pay | Admitting: Obstetrics & Gynecology

## 2013-07-10 LAB — CBC
HCT: 34.6 % — ABNORMAL LOW (ref 36.0–46.0)
MCV: 86.5 fL (ref 78.0–100.0)
Platelets: 141 10*3/uL — ABNORMAL LOW (ref 150–400)
RBC: 4 MIL/uL (ref 3.87–5.11)
RDW: 15.6 % — ABNORMAL HIGH (ref 11.5–15.5)
WBC: 10.6 10*3/uL — ABNORMAL HIGH (ref 4.0–10.5)

## 2013-07-10 LAB — COMPREHENSIVE METABOLIC PANEL
ALT: 10 U/L (ref 0–35)
Albumin: 1.8 g/dL — ABNORMAL LOW (ref 3.5–5.2)
BUN: 11 mg/dL (ref 6–23)
CO2: 25 mEq/L (ref 19–32)
Calcium: 9.4 mg/dL (ref 8.4–10.5)
Creatinine, Ser: 0.86 mg/dL (ref 0.50–1.10)
GFR calc Af Amer: >90 mL/min (ref >90)
GFR calc non Af Amer: 89 mL/min — ABNORMAL LOW (ref >90)
Glucose, Bld: 114 mg/dL — ABNORMAL HIGH (ref 70–99)
Total Protein: 5 g/dL — ABNORMAL LOW (ref 6.0–8.3)

## 2013-07-10 LAB — LACTATE DEHYDROGENASE: LDH: 141 U/L (ref 94–250)

## 2013-07-10 NOTE — Progress Notes (Signed)
Subjective: Postpartum Day 1: Cesarean Delivery Patient reports tolerating PO and + flatus.   Baby in NICU for grunting and low blood sugars Patient denies Ha, blurred vision. Or RUQ pain.   Objective: Vital signs in last 24 hours: Temp:  [98 F (36.7 C)-98.5 F (36.9 C)] 98.5 F (36.9 C) (10/22 0506) Pulse Rate:  [68-95] 79 (10/22 0506) Resp:  [16-24] 18 (10/22 0506) BP: (105-150)/(56-85) 115/73 mmHg (10/22 0506) SpO2:  [95 %-100 %] 98 % (10/22 0506)  Physical Exam:  General: alert and cooperative Lochia: appropriate Uterine Fundus: firm Incision: honeycomb dressing CDI DVT Evaluation: No evidence of DVT seen on physical exam. Negative Homan's sign. No cords or calf tenderness. No significant calf/ankle edema. DTR's 2+   Recent Labs  07/09/13 0754 07/10/13 0530  HGB 12.7 11.3*  HCT 38.3 34.6*   LFT's - WNL Assessment/Plan: Status post Cesarean section. Doing well postoperatively.  Continue current care.  La Dibella G 07/10/2013, 8:41 AM

## 2013-07-10 NOTE — Anesthesia Postprocedure Evaluation (Signed)
  Anesthesia Post-op Note  Patient: [name]  Procedure(s) Performed: * No procedures listed *  Patient Location: Women's Unit  Anesthesia Type:Spinal  Level of Consciousness: awake  Airway and Oxygen Therapy: Patient Spontanous Breathing  Post-op Pain: none  Post-op Assessment: Patient's Cardiovascular Status Stable, Respiratory Function Stable, Patent Airway, No signs of Nausea or vomiting, Adequate PO intake, Pain level controlled, No headache, No backache, No residual numbness and No residual motor weakness  Post-op Vital Signs: Reviewed and stable  Complications: No apparent anesthesia complications

## 2013-07-10 NOTE — Lactation Note (Signed)
This note was copied from the chart of Laurie Ceili Boshers. Lactation Consultation Note     Follow up consult with this mom and dad of a NICU baby. Mom has pumped with her first baby, for 8 months, she is now 29 hours post partum, and is expressing tiny drops of colostrum. I did teaching today with mom and dad from the NICU booklet on providing EBM   , and showed both mom and dad how to hand express mom's breasts. Dad very involved and helpful. Mom  Knows to call for questions/concerns. She will have to rent a DEP, until she get a DEP from her  [employer]. I left mom the paper work to fill ou for the pump rental  Patient Name: [name] [id number] Date: 07/10/2013 Reason for consult: Follow-up assessment;NICU baby   Maternal Data    Feeding    LATCH Score/Interventions                      Lactation Tools Discussed/Used     Consult Status Consult Status: Follow-up Date: 07/11/13 Follow-up type: In-patient    Alfred Levins 07/10/2013, 3:34 PM

## 2013-07-11 NOTE — Lactation Note (Addendum)
This note was copied from the chart of [name]. Lactation Consultation Note    Follow up consult with this mom of a NICU baby, 37 3/7 corrected gestation, and 49 hours post partum. Mom has been pumping every 3 hours, but has not expressed any colostrum yet. I explained that this  Is normal in come cases, and  That she should see some breast changes in the next 24 hours. I was able to express a tiny drop os colostrum form her left breast. Mom has very large breasts, with loose skin, that makes it dfficult to hand express. Mom also had not centered the flange over her left nipple, causing some skin breakdown and redness.  I increased mom to size 30 flanges, which should helpI made mom a bra from the mesh underwear, and gave her comfort gels, which mom said felt good. I also showed mom how to support her breast with pillows while pumping, to better control the flange position.    Mom knows to call for questions/concerns.  Patient Name: [name] [id number] Date: 07/11/2013 Reason for consult: Follow-up assessment;NICU baby   Maternal Data    Feeding    LATCH Score/Interventions          Comfort (Breast/Nipple): Filling, red/small blisters or bruises, mild/mod discomfort  Problem noted: Mild/Moderate discomfort Interventions (Mild/moderate discomfort): Comfort gels        Lactation Tools Discussed/Used     Consult Status Consult Status: Follow-up Date: 07/12/13 Follow-up type: In-patient    Alfred Levins 07/11/2013, 11:48 AM

## 2013-07-11 NOTE — Progress Notes (Signed)
Subjective: Postpartum Day 2: Cesarean Delivery Patient reports tolerating PO, + flatus and no problems voiding.  Baby stable in NICU  Objective: Vital signs in last 24 hours: Temp:  [98 F (36.7 C)-99 F (37.2 C)] 98.3 F (36.8 C) (10/23 0545) Pulse Rate:  [79-93] 79 (10/23 0545) Resp:  [16-21] 16 (10/23 0545) BP: (120-124)/(78-85) 122/80 mmHg (10/23 0545) SpO2:  [97 %-98 %] 98 % (10/23 0545)  Physical Exam:  General: alert and cooperative Lochia: appropriate Uterine Fundus: firm Incision: healing well DVT Evaluation: No evidence of DVT seen on physical exam. Negative Homan's sign. No cords or calf tenderness. No significant calf/ankle edema.   Recent Labs  07/09/13 0754 07/10/13 0530  HGB 12.7 11.3*  HCT 38.3 34.6*    Assessment/Plan: Status post Cesarean section. Doing well postoperatively.  Continue current care.  Taseen Marasigan G 07/11/2013, 9:31 AM

## 2013-07-11 NOTE — Progress Notes (Signed)
Clinical Social Work Department BRIEF PSYCHOSOCIAL ASSESSMENT 07/10/2013  Patient:  [name],[name]     Account Number:  [id number]     Admit date:  07/09/2013  Clinical Social Worker:  Merced Brougham, LCSW  Date/Time:  07/10/2013 03:00 PM  Referred by:    Date Referred:    Other Referral:   No referral-NICU admission   Interview type:  Patient Other interview type:   Chart review    PSYCHOSOCIAL DATA Living Status:  FAMILY Admitted from facility:   Level of care:   Primary support name:  [name] Primary support relationship to patient:  SPOUSE Degree of support available:   Good supports.  Family here visiting.    CURRENT CONCERNS Current Concerns  None Noted   Other Concerns:   Hx of PPD    SOCIAL WORK ASSESSMENT / PLAN CSW met with MOB in her third floor room to introduce myself, complete assessment for NICU admission and offer support.  CSW explained ongoing support services offered by NICU CSW and gave contact information.  CSW reviewed signs and symptoms of PPD.  CSW asked MOB to call CSW any time. CSW has no social concerns at this time.  Pediatric follow up will be with Dr. Hooker.   Assessment/plan status:  Psychosocial Support/Ongoing Assessment of Needs Other assessment/ plan:   Information/referral to community resources:   No referral needs noted at this time.    PATIENT'S/FAMILY'S RESPONSE TO PLAN OF CARE: MOB was very pleasant, but soft spoken and stated that she was feeling tired.  She reports otherwise doing well.  She states her first daughter was in the NICU at birth for 6 night for hypoglycemia and photo therapy.  She acknowledges the sadness of being separated from baby, but states she feels she is coping much better with this NICU admission because she got to see baby immediately after delivery and had to wait 2 days with her first child.  She also feels she was better prepared for the possibility that baby would need intensive care at birth.  She  reports having PPD with her first child, and is hopeful that she will not experience PPD this time.  She is aware of signs and symptoms and feels comfortable talking with her doctor if symptoms arise.  MOB reports having a good support system and everything she needs for baby at home.  She states she will be out of work until January and that FOB has a week off.  She thanked CSW for the visit and states no questions or needs at this tinme.        

## 2013-07-12 ENCOUNTER — Encounter (HOSPITAL_COMMUNITY)
Admit: 2013-07-12 | Discharge: 2013-07-12 | Disposition: A | Discharge status: Home / Self Care | Payer: BC Managed Care – PPO | Attending: Obstetrics and Gynecology | Admitting: Obstetrics and Gynecology

## 2013-07-12 NOTE — Progress Notes (Signed)
Subjective: Postpartum Day 3: Cesarean Delivery Patient reports tolerating PO, + flatus and no problems voiding.  Baby in NICU on nasal canula  Objective: Vital signs in last 24 hours: Temp:  [98 F (36.7 C)-99 F (37.2 C)] 98.5 F (36.9 C) (10/24 0521) Pulse Rate:  [77-84] 80 (10/24 0521) Resp:  [16-20] 16 (10/24 0521) BP: (122-132)/(80-84) 127/84 mmHg (10/24 0521) SpO2:  [98 %-100 %] 98 % (10/24 0521)  Physical Exam:  General: alert and cooperative Lochia: appropriate Uterine Fundus: firm Incision: healing well DVT Evaluation: No evidence of DVT seen on physical exam. Negative Homan's sign. No cords or calf tenderness.   Recent Labs  07/10/13 0530  HGB 11.3*  HCT 34.6*    Assessment/Plan: Status post Cesarean section. Doing well postoperatively.  Continue current care.  Lene Mckay G 07/12/2013, 9:11 AM

## 2013-07-13 MED ORDER — OXYCODONE-ACETAMINOPHEN 5-325 MG PO TABS: 1.0000 | ORAL_TABLET | ORAL | Status: AC | PRN

## 2013-07-13 MED ORDER — IBUPROFEN 600 MG PO TABS: 600.0000 mg | ORAL_TABLET | Freq: Four times a day (QID) | ORAL | Status: AC

## 2013-07-13 NOTE — Discharge Summary (Signed)
Obstetric Discharge Summary Reason for Admission: cesarean section and rupture of membranes Prenatal Procedures: none Intrapartum Procedures: cesarean: low cervical, transverse Postpartum Procedures: none Complications-Operative and Postpartum: none Hemoglobin  Date Value Range Status  07/10/2013 11.3* 12.0 - 15.0 g/dL Final     HCT  Date Value Range Status  07/10/2013 34.6* 36.0 - 46.0 % Final    Physical Exam:  General: alert Lochia: appropriate Uterine Fundus: firm Incision: healing well DVT Evaluation: No evidence of DVT seen on physical exam.  Discharge Diagnoses: Term Pregnancy-delivered  Discharge Information: Date: 07/13/2013 Activity: pelvic rest Diet: routine Medications: PNV, Ibuprofen and Percocet Condition: stable Instructions: refer to practice specific booklet Discharge to: home Follow-up Information   Follow up with Meriel Pica, MD. Schedule an appointment as soon as possible for a visit in 5 days.   Specialty:  Obstetrics and Gynecology   Contact information:   [address] [place] [address] [phone]       Newborn Data: Live born female  Birth Weight: 12 lb 13.8 oz (5835 g) APGAR: 2, 7  Home with NICU.  [name] M 07/13/2013, 8:03 AM

## 2013-07-13 NOTE — Lactation Note (Signed)
This note was copied from the chart of Laurie Marcelene Weidemann. Lactation Consultation Note Mom reports that pumping is going well but one is not working well. New yellow pieces and white membranes given to try. Is eating lunch now so will try pump before she goes home, Has rental for home use. No questions at present. To call prn  Patient Name: [name] [id number] Date: 07/13/2013 Reason for consult: Follow-up assessment   Maternal Data    Feeding Feeding Type: Formula  LATCH Score/Interventions                      Lactation Tools Discussed/Used     Consult Status Consult Status: Complete    Pamelia Hoit 07/13/2013, 11:47 AM

## 2013-07-13 NOTE — Progress Notes (Signed)
Pt discharged to home with husband, daughter, and mother.  Condition stable.  Pt ambulated to car with Almira Coaster, NT.  No equipment for home ordered at discharge.

## 2013-07-15 ENCOUNTER — Other Ambulatory Visit (HOSPITAL_COMMUNITY): Payer: BC Managed Care – PPO

## 2013-07-15 NOTE — Progress Notes (Signed)
Ur chart review completed.  

## 2013-07-16 ENCOUNTER — Inpatient Hospital Stay (HOSPITAL_COMMUNITY)
Admission: AD | Admit: 2013-07-16 | Payer: BC Managed Care – PPO | Source: Ambulatory Visit | Admitting: Obstetrics and Gynecology

## 2013-07-16 SURGERY — Surgical Case
Anesthesia: Regional

## 2013-07-22 ENCOUNTER — Other Ambulatory Visit (HOSPITAL_COMMUNITY): Payer: BC Managed Care – PPO

## 2013-07-29 ENCOUNTER — Ambulatory Visit: Payer: Self-pay

## 2013-07-29 NOTE — Lactation Note (Signed)
This note was copied from the chart of [name]. Lactation Consultation Note     Follow up consulswith this mom and NICU baby, now 33 weeks old, and 40 weeks corrected gestation. [name] is LGA, now weighing 13 lbs 4.5 oz. She has been ng fed EBM 90 every 3 hours. She roots when bottle offered, but then pulls away, with facial grimace. I spoke to [place] , [place]. and baby's nurse, Gilda Crease. The baby was diagnosed with oral thrush today, and is being started on nystatin. This could be why she does not want to feed. She did well at the breast for 2 5 minute feeds, but again got fussy. I advised mom to just hold her skin to skin when she no longer wanted to breast feed. I will cotinue to work with this mom and baby. I told mom  that she and baby did well for today.       Since mom will be breast feeding and the baby has thruch, mom called her OB Dr. Vincente Poli, and was prescribed all purpose nipple cream, to possibly treat (red, tender nipples ), or prevent infection.   Patient Name: [name] [id number] Date: 07/29/2013 Reason for consult: Follow-up assessment;NICU baby   Maternal Data    Feeding Feeding Type: Breast Fed Length of feed: 10 min  LATCH Score/Interventions Latch: Grasps breast easily, tongue down, lips flanged, rhythmical sucking. (fed well for 5 minutes, then applied 24 nipple shiled, and fed for additional 5 minutes, then fussy and feeding stopped. Lots of milk in shield)  Audible Swallowing: Spontaneous and intermittent  Type of Nipple: Flat (very compressible tissue) Intervention(s):  (24 nipple shiled)  Comfort (Breast/Nipple): Filling, red/small blisters or bruises, mild/mod discomfort (red, tender nipples)  Problem noted: Mild/Moderate discomfort  Hold (Positioning): Assistance needed to correctly position infant at breast and maintain latch. Intervention(s): Breastfeeding basics reviewed;Support Pillows;Position options;Skin to skin  LATCH Score: 7  Lactation  Tools Discussed/Used Tools: Nipple Shields Nipple shield size: 24   Consult Status Consult Status: Follow-up Date: 07/30/13 Follow-up type: In-patient    Alfred Levins 07/29/2013, 3:01 PM

## 2013-08-20 ENCOUNTER — Other Ambulatory Visit: Payer: Self-pay | Admitting: Obstetrics and Gynecology

## 2013-10-02 ENCOUNTER — Encounter: Payer: BC Managed Care – PPO | Attending: Endocrinology | Admitting: *Deleted

## 2013-10-02 ENCOUNTER — Encounter: Payer: Self-pay | Admitting: *Deleted

## 2013-10-02 VITALS — Ht 64.0 in | Wt 236.6 lb

## 2013-10-02 DIAGNOSIS — Z713 Dietary counseling and surveillance: Secondary | ICD-10-CM | POA: Insufficient documentation

## 2013-10-02 DIAGNOSIS — E119 Type 2 diabetes mellitus without complications: Secondary | ICD-10-CM

## 2013-10-02 NOTE — Progress Notes (Signed)
Appt start time: 1430 end time:  1600. [name] come today with a history of GDM and good management with her 32 yr old daughter. She recently delivered second child in October 2014 and found to have an elevated glucose level in advanced gestation. She now has a new diagnosis of T2DM. She has been directed to work on a meal plan and exercise for three months and return for updated A1c in 3 months with Dr. Talmage NapBalan. [name] is not presently testing her glucose or taking any medications. Family history of T2DM with her Grandparents.[name] has been on maternity leave and will be returning to work on Monday 10/07/13. She is under a great deal of stress as her newborn is fed through a gastric tube and has no extended family support. Her husband is supportive. [name] does the grocery shopping and cooking.  Assessment:  Patient was seen on  10/02/13 for individual diabetes education.   Current HbA1c: 7.0%  Preferred Learning Style:    Auditory  Visual  Hands on  No preference indicated   Learning Readiness:   Ready  MEDICATIONS: None  DIETARY INTAKE:  Usual physical activity: None at this time.   Intervention:  Nutrition counseling provided.  Discussed diabetes disease process and treatment options.  Discussed physiology of diabetes and role of obesity on insulin resistance.  Encouraged moderate weight reduction to improve glucose levels.  Discussed role of medications and diet in glucose control  Provided education on macronutrients on glucose levels.  Provided education on carb counting, importance of regularly scheduled meals/snacks, and meal planning  Discussed effects of physical activity on glucose levels and long-term glucose control.  Recommended 150 minutes of physical activity/week.  Reviewed patient medications.  Discussed role of medication on blood glucose and possible side effects  Discussed blood glucose monitoring and interpretation.  Discussed recommended target ranges and individual  ranges.    Described short-term complications: hyper- and hypo-glycemia.  Discussed causes,symptoms, and treatment options.  Discussed prevention, detection, and treatment of long-term complications.  Discussed the role of prolonged elevated glucose levels on body systems.  Discussed role of stress on blood glucose levels and discussed strategies to manage psychosocial issues.  Discussed recommendations for long-term diabetes self-care.  Established checklist for medical, dental, and emotional self-care.  Plan:  Aim for 2-3 Carb Choices per meal (30-45 grams) +/- 1 either way  Aim for 0-1 Carbs per snack if hungry  Consider reading food labels for Total Carbohydrate and Fat Grams of foods Consider  increasing your activity level by walking  for 30 minutes daily as tolerated Consider checking BG at alternate times per day as directed by MD  Consider: using [employer] instead of butter                  Fry eggs with olive oil spray  Teaching Method Utilized:  Visual Auditory Hands on  Handouts given during visit include: Living Well with Diabetes Carb Counting and Food Label handouts Meal Plan Card Plate Method  Barriers to learning/adherence to lifestyle change: New baby  Diabetes self-care support plan:   [facility] support group  Husband  Demonstrated degree of understanding via:  Teach Back   Monitoring/Evaluation:  Dietary intake, exercise, and body weight return for follow up prn.

## 2013-10-02 NOTE — Patient Instructions (Signed)
Plan:  Aim for 2-3 Carb Choices per meal (30-45 grams) +/- 1 either way  Aim for 0-1 Carbs per snack if hungry  Consider reading food labels for Total Carbohydrate and Fat Grams of foods Consider  increasing your activity level by walking  for 30 minutes daily as tolerated Consider checking BG at alternate times per day as directed by MD  Consider: using [employer] instead of butter                  Fry eggs with olive oil spray

## 2013-10-16 ENCOUNTER — Ambulatory Visit: Payer: BC Managed Care – PPO | Admitting: *Deleted

## 2014-06-02 IMAGING — US US FETAL BPP W/O NONSTRESS
1 series · 10 of 10 positions shown · non-contrast
Comparison: none

[Series 1: us fetal bpp w/o nonstress · non-contrast · 10 acquisitions, 10 frames shown]
[im 1/10]
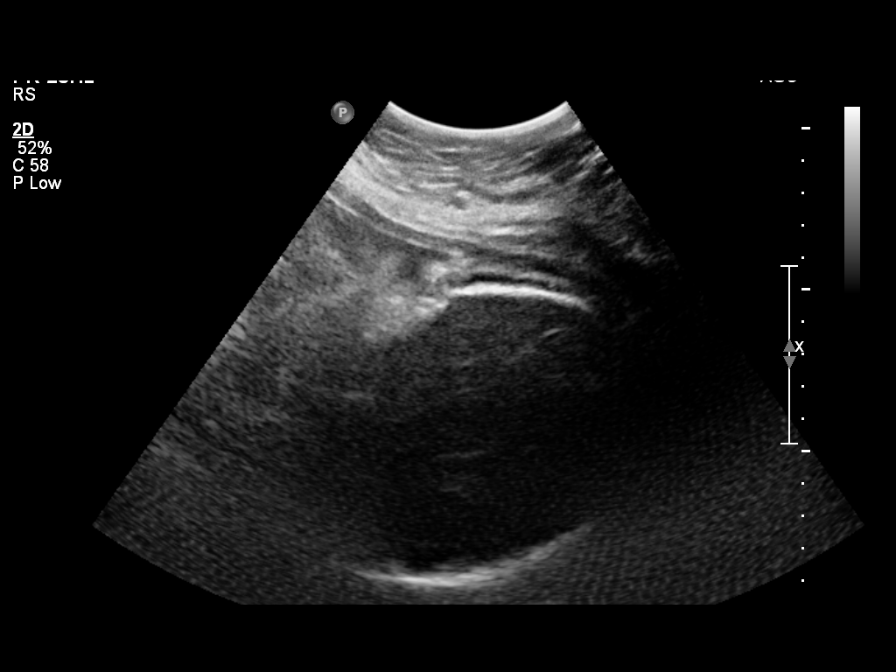
[im 2/10]
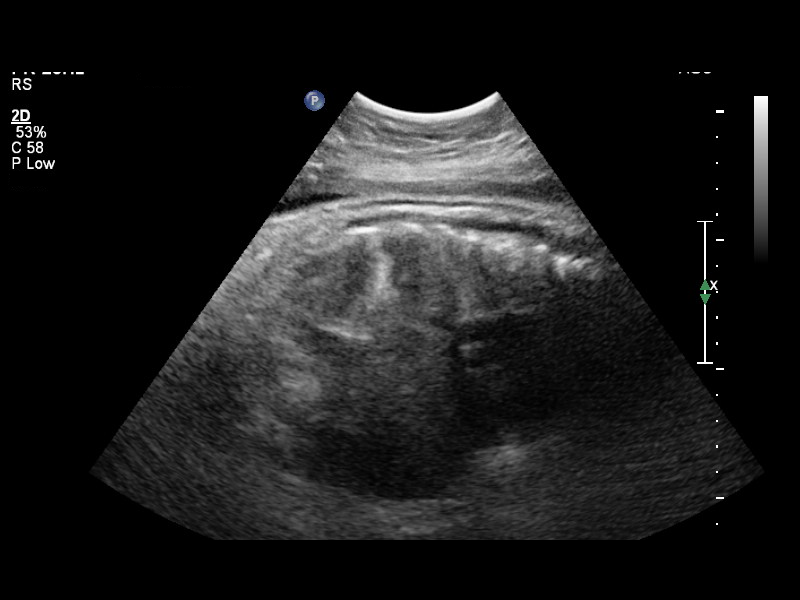
[im 3/10]
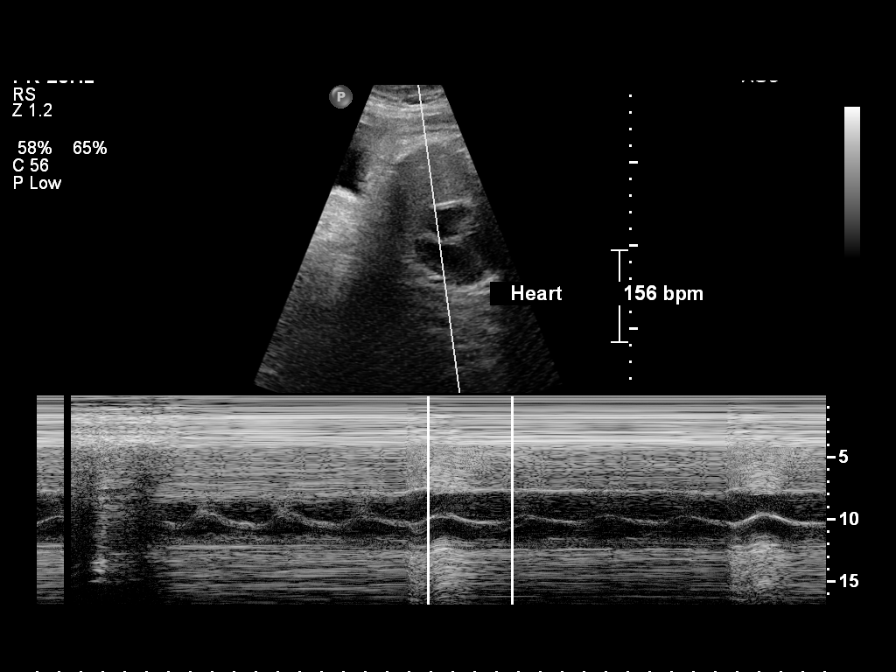
[im 4/10]
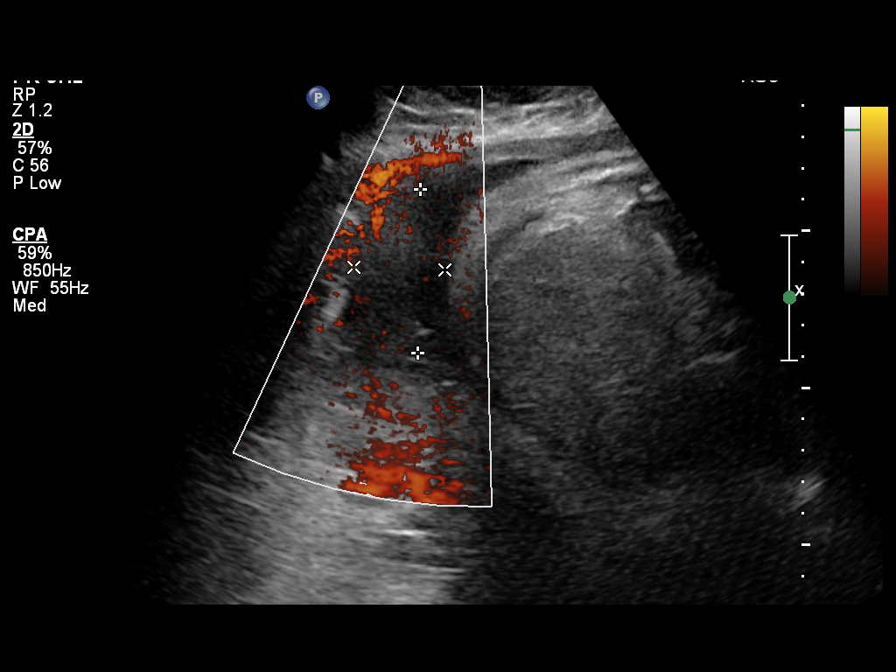
[im 5/10]
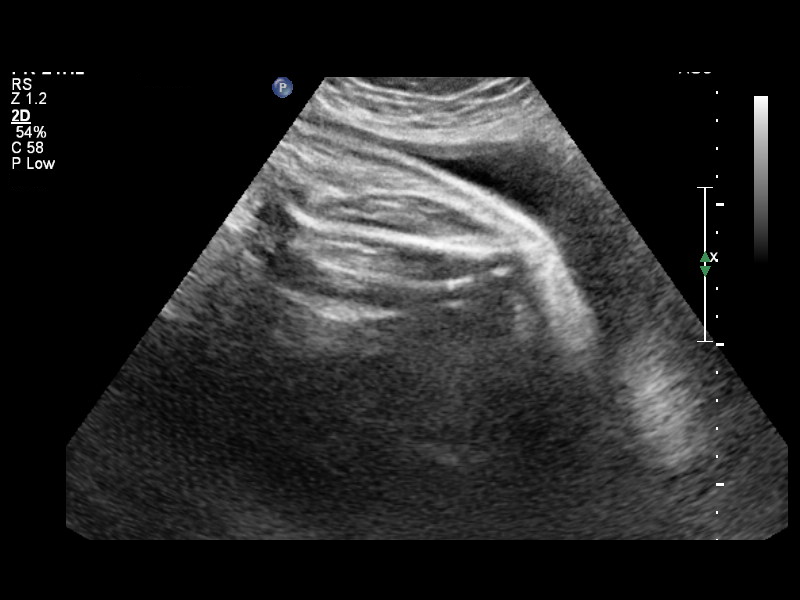
[im 6/10]
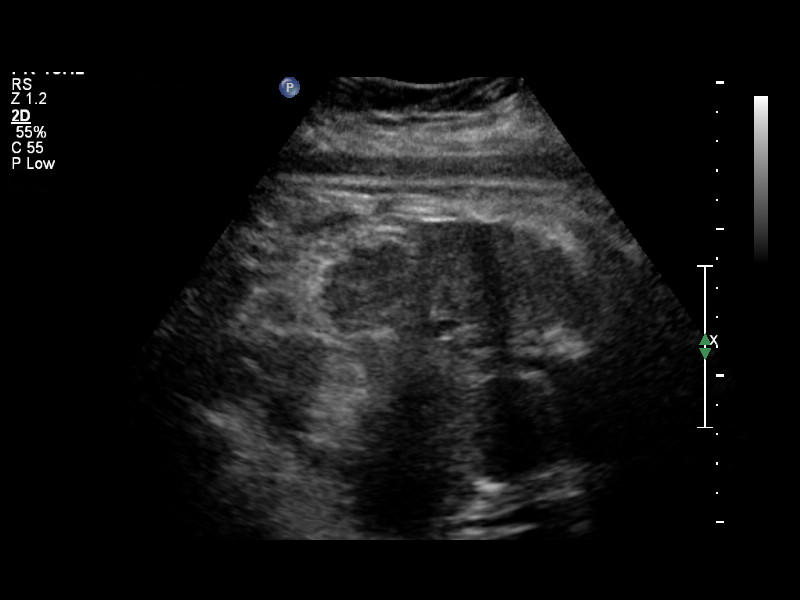
[im 7/10]
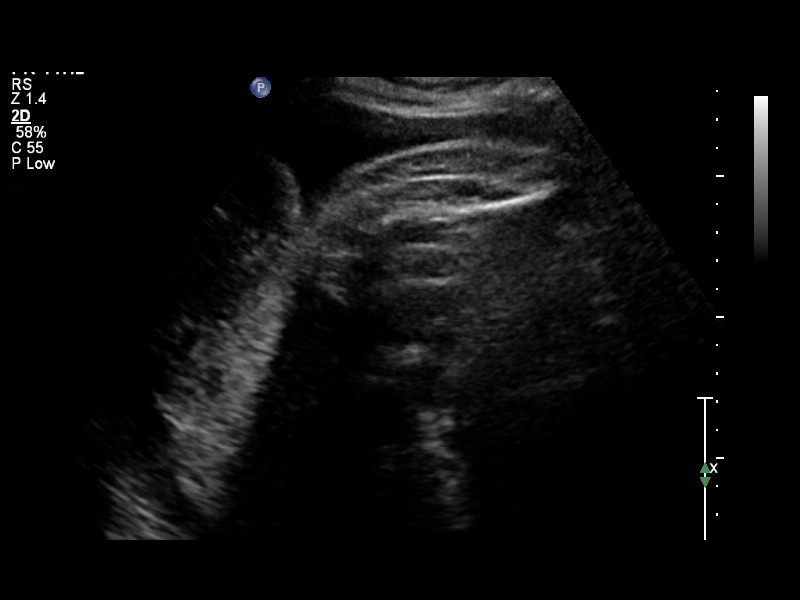
[im 8/10]
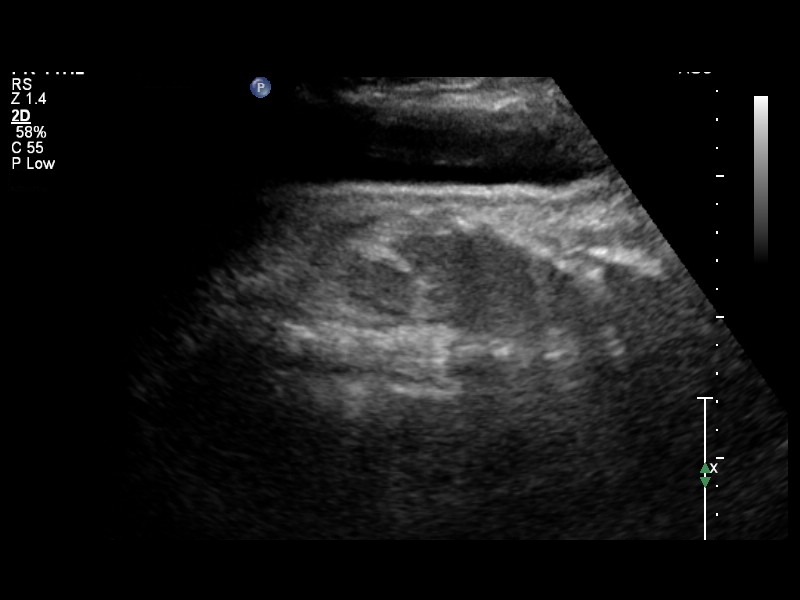
[im 9/10]
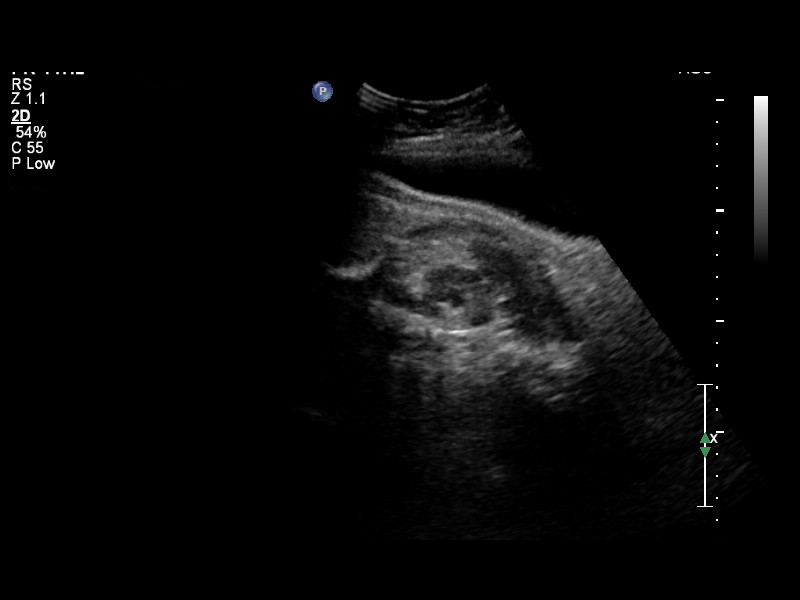
[im 10/10]
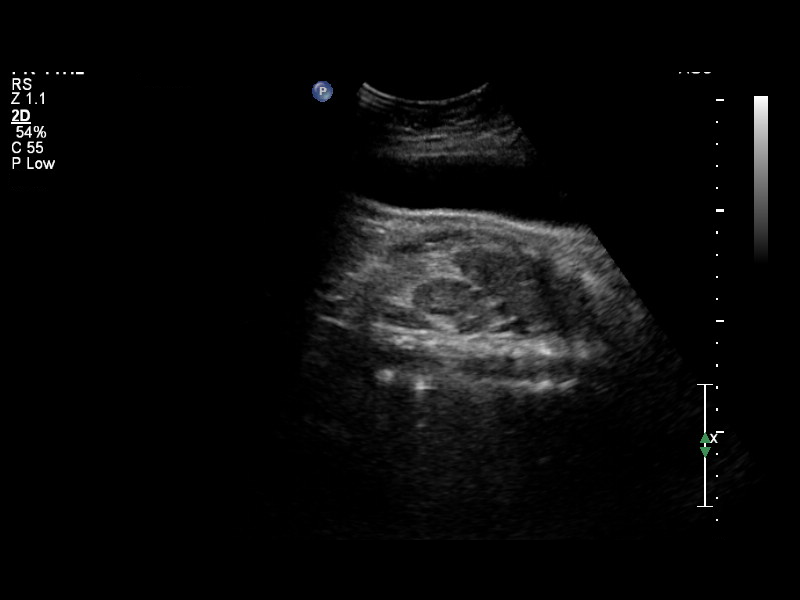

[10 of 10 positions shown; findings below may reference images not displayed]

OBSTETRICS REPORT
                      (Signed Final 06/29/2013 [DATE])

Service(s) Provided

Indications

 Non-reactive NST
 Hypertension - Gestational
 Previous cesarean section
Fetal Evaluation

 Num Of Fetuses:    1
 Fetal Heart Rate:  156                          bpm
 Cardiac Activity:  Observed
 Presentation:      Cephalic

 Amniotic Fluid
 AFI FV:      Subjectively within normal limits
                                              Larg Pckt:   5.19  cm
Biophysical Evaluation

 Amniotic F.V:   Pocket => 2 cm two         F. Tone:         Observed
                 planes
 F. Movement:    Observed                   Score:           [DATE]
 F. Breathing:   Observed
Gestational Age

 Clinical EDD:  35w 4d                                        EDD:    07/29/13
 Best:          35w 4d     Det. By:  Clinical EDD             EDD:    07/29/13
Impression

 SIUP at 35+4 weeks
 Normal amniotic fluid volume by vertical pocket
 BPP [DATE]
Recommendations

 Follow-up as clinically indicated

 questions or concerns.

## 2014-07-21 ENCOUNTER — Encounter: Payer: Self-pay | Admitting: *Deleted

## 2017-01-18 ENCOUNTER — Institutional Professional Consult (permissible substitution): Payer: BC Managed Care – PPO | Admitting: Pulmonary Disease
